# Patient Record
Sex: Female | Born: 1995
Health system: Southern US, Community
[De-identification: ages and names within clinical notes are randomized; demographics above are authoritative.]

## PROBLEM LIST (undated history)

## (undated) DIAGNOSIS — N289 Disorder of kidney and ureter, unspecified: Secondary | ICD-10-CM

## (undated) DIAGNOSIS — G43909 Migraine, unspecified, not intractable, without status migrainosus: Secondary | ICD-10-CM

## (undated) DIAGNOSIS — N3090 Cystitis, unspecified without hematuria: Secondary | ICD-10-CM

## (undated) DIAGNOSIS — R112 Nausea with vomiting, unspecified: Secondary | ICD-10-CM

## (undated) DIAGNOSIS — K589 Irritable bowel syndrome without diarrhea: Secondary | ICD-10-CM

## (undated) DIAGNOSIS — Z9889 Other specified postprocedural states: Secondary | ICD-10-CM

## (undated) HISTORY — PX: WISDOM TOOTH EXTRACTION: SHX21

## (undated) NOTE — *Deleted (*Deleted)
HEMATOLOGY/ONCOLOGY CONSULTATION NOTE  Date of Service: 08/20/2020  Patient Care Team: System, Provider Not In as PCP - General  CHIEF COMPLAINTS/PURPOSE OF CONSULTATION:  Anemia  HISTORY OF PRESENTING ILLNESS:  Mary Orozco is a wonderful 17 y.o. female who has been referred to [place] by Jarrett Soho, PA-C for evaluation and management of anemia. Pt is accompanied today by ***. The pt reports that she is doing well overall.   The pt reports ***   Of note prior to the patient's visit today, pt has had *** completed on *** with results revealing ***.   Most recent lab results (***) of CBC is as follows: all values are WNL except for ***.  On review of systems, pt reports *** and denies ***and any other symptoms.   On PMHx the pt reports ***. On Social Hx the pt reports *** On Family Hx the pt reports ***  A: -Discussed patient's most recent labs from ***, *** -***  MEDICAL HISTORY:  Past Medical History:  Diagnosis Date  . Cystitis   . IBS (irritable bowel syndrome)   . Migraine   . Renal disorder    kidney stones    SURGICAL HISTORY: Past Surgical History:  Procedure Laterality Date  . WISDOM TOOTH EXTRACTION      SOCIAL HISTORY: Social History   Socioeconomic History  . Marital status: Married    Spouse name: Not on file  . Number of children: Not on file  . Years of education: Not on file  . Highest education level: Not on file  Occupational History  . Not on file  Tobacco Use  . Smoking status: Never Smoker  . Smokeless tobacco: Never Used  Substance and Sexual Activity  . Alcohol use: No  . Drug use: No  . Sexual activity: Yes  Other Topics Concern  . Not on file  Social History Narrative  . Not on file   Social Determinants of Health   Financial Resource Strain:   . Difficulty of Paying Living Expenses: Not on file  Food Insecurity:   . Worried About Programme researcher, broadcasting/film/video in the Last Year: Not on file  . Ran Out of Food in the  Last Year: Not on file  Transportation Needs:   . Lack of Transportation (Medical): Not on file  . Lack of Transportation (Non-Medical): Not on file  Physical Activity:   . Days of Exercise per Week: Not on file  . Minutes of Exercise per Session: Not on file  Stress:   . Feeling of Stress : Not on file  Social Connections:   . Frequency of Communication with Friends and Family: Not on file  . Frequency of Social Gatherings with Friends and Family: Not on file  . Attends Religious Services: Not on file  . Active Member of Clubs or Organizations: Not on file  . Attends Banker Meetings: Not on file  . Marital Status: Not on file  Intimate Partner Violence:   . Fear of Current or Ex-Partner: Not on file  . Emotionally Abused: Not on file  . Physically Abused: Not on file  . Sexually Abused: Not on file    FAMILY HISTORY: Family History  Problem Relation Age of Onset  . Diabetes Other   . CAD Other     ALLERGIES:  is allergic to diflucan [fluconazole] and sulfa antibiotics.  MEDICATIONS:  Current Outpatient Medications  Medication Sig Dispense Refill  . ALPRAZolam (XANAX) 0.5 MG tablet Take 0.5  mg by mouth 2 (two) times daily as needed for anxiety.    Marland Kitchen amitriptyline (ELAVIL) 10 MG tablet Take 10 mg by mouth at bedtime.    Marland Kitchen esomeprazole (NEXIUM) 40 MG capsule Take 40 mg by mouth daily at 12 noon.    Marland Kitchen ibuprofen (ADVIL,MOTRIN) 600 MG tablet Take 1 tablet (600 mg total) by mouth every 6 (six) hours as needed. 30 tablet 0  . loratadine (CLARITIN) 10 MG tablet Take 10 mg by mouth daily.    . nitrofurantoin (MACRODANTIN) 100 MG capsule Take 100 mg by mouth daily.     . ondansetron (ZOFRAN) 4 MG tablet Take 1 tablet (4 mg total) by mouth every 6 (six) hours. 12 tablet 0  . pentosan polysulfate (ELMIRON) 100 MG capsule Take 1 capsule (100 mg total) by mouth 3 (three) times daily. 90 capsule 2  . pregabalin (LYRICA) 150 MG capsule Take 150 mg by mouth 2 (two) times  daily.    . ranitidine (ZANTAC) 150 MG tablet Take 150 mg by mouth 2 (two) times daily as needed for heartburn.     . tamsulosin (FLOMAX) 0.4 MG CAPS capsule Take 1 capsule (0.4 mg total) by mouth daily. 5 capsule 0   No current facility-administered medications for this visit.    REVIEW OF SYSTEMS:    10 Point review of Systems was done is negative except as noted above.  PHYSICAL EXAMINATION: ECOG PERFORMANCE STATUS: {CHL ONC ECOG ZO:[id number]}  .There were no vitals filed for this visit. There were no vitals filed for this visit. .There is no height or weight on file to calculate BMI.  *** GENERAL:alert, in no acute distress and comfortable SKIN: no acute rashes, no significant lesions EYES: conjunctiva are pink and non-injected, sclera anicteric OROPHARYNX: MMM, no exudates, no oropharyngeal erythema or ulceration NECK: supple, no JVD LYMPH:  no palpable lymphadenopathy in the cervical, axillary or inguinal regions LUNGS: clear to auscultation b/l with normal respiratory effort HEART: regular rate & rhythm ABDOMEN:  normoactive bowel sounds , non tender, not distended. Extremity: no pedal edema PSYCH: alert & oriented x 3 with fluent speech NEURO: no focal motor/sensory deficits  LABORATORY DATA:  I have reviewed the data as listed  . CBC Latest Ref Rng & Units 08/23/2018 03/27/2017 09/14/2016  WBC 4.0 - 10.5 K/uL 8.3 9.2 9.2  Hemoglobin 12.0 - 15.0 g/dL 10.2(L) 11.5(L) 7.6(L)  Hematocrit 36 - 46 % 34.7(L) 36.4 24.8(L)  Platelets 150 - 400 K/uL 369 362 225    . CMP Latest Ref Rng & Units 08/23/2018 03/27/2017 09/14/2016  Glucose 70 - 99 mg/dL 89 96 811(B)  BUN 6 - 20 mg/dL 11 7 12   Creatinine 0.44 - 1.00 mg/dL 1.47 8.29 5.62  Sodium 135 - 145 mmol/L 137 135 136  Potassium 3.5 - 5.1 mmol/L 4.3 4.1 3.8  Chloride 98 - 111 mmol/L 105 102 104  CO2 22 - 32 mmol/L 24 25 25   Calcium 8.9 - 10.3 mg/dL 9.4 9.1 1.3(Y)  Total Protein 6.5 - 8.1 g/dL 7.2 - 6.7  Total Bilirubin  0.3 - 1.2 mg/dL 0.7 - 1.1  Alkaline Phos 38 - 126 U/L 85 - 55  AST 15 - 41 U/L 28 - 23  ALT 0 - 44 U/L 15 - 12(L)     RADIOGRAPHIC STUDIES: I have personally reviewed the radiological images as listed and agreed with the findings in the report. No results found.  ASSESSMENT & PLAN:   PLAN: ***   FOLLOW UP: ***  All of the patients questions were answered with apparent satisfaction. The patient knows to call the clinic with any problems, questions or concerns.  I spent *** counseling the patient face to face. The total time spent in the appointment was *** and more than 50% was on counseling and direct patient cares.    Wyvonnia Lora MD MS AAHIVMS [facility] Hematology/Oncology Physician [facility]  (Office):       [phone] (Work cell):  [phone] (Fax):           [phone]  08/20/2020 8:00 AM  I, Carollee Herter, am acting as a scribe for Dr. Wyvonnia Lora.   {Add Production assistant, radio Statement}

---

## 2015-01-09 ENCOUNTER — Emergency Department (INDEPENDENT_AMBULATORY_CARE_PROVIDER_SITE_OTHER)
Admission: EM | Admit: 2015-01-09 | Discharge: 2015-01-09 | Disposition: A | Discharge status: Home / Self Care | Payer: No Typology Code available for payment source | Source: Home / Self Care | Attending: Family Medicine | Admitting: Family Medicine

## 2015-01-09 ENCOUNTER — Encounter (HOSPITAL_COMMUNITY): Payer: Self-pay

## 2015-01-09 DIAGNOSIS — S86811A Strain of other muscle(s) and tendon(s) at lower leg level, right leg, initial encounter: Secondary | ICD-10-CM

## 2015-01-09 DIAGNOSIS — S86111A Strain of other muscle(s) and tendon(s) of posterior muscle group at lower leg level, right leg, initial encounter: Secondary | ICD-10-CM

## 2015-01-09 NOTE — ED Provider Notes (Signed)
CSN: [id number]     Arrival date & time 01/09/15  1620 History   First MD Initiated Contact with Patient 01/09/15 1722     Chief Complaint  Patient presents with  . Leg Pain   (Consider location/radiation/quality/duration/timing/severity/associated sxs/prior Treatment) Patient is a 19 y.o. female presenting with leg pain. The history is provided by the patient and a parent.  Leg Pain Location:  Leg Time since incident:  2 days Injury: no   Leg location:  R upper leg Pain details:    Quality:  Sharp   Radiates to:  Does not radiate   Severity:  Mild   Onset quality:  Gradual   Progression:  Unchanged Chronicity:  New Dislocation: no   Prior injury to area:  No Relieved by:  None tried Worsened by:  Nothing tried Ineffective treatments:  None tried Associated symptoms: no decreased ROM, no muscle weakness, no numbness and no swelling   Risk factors comment:  Takes depoprovera and concerned for dvt.   History reviewed. No pertinent past medical history. History reviewed. No pertinent past surgical history. History reviewed. No pertinent family history. History  Substance Use Topics  . Smoking status: Never Smoker   . Smokeless tobacco: Not on file  . Alcohol Use: No   OB History    No data available     Review of Systems  Constitutional: Negative.   Respiratory: Negative for chest tightness and shortness of breath.   Cardiovascular: Negative.   Musculoskeletal: Negative for joint swelling and gait problem.  Skin: Negative.     Allergies  Sulfa antibiotics  Home Medications   Prior to Admission medications   Not on File   BP 115/82 mmHg  Pulse 88  Temp(Src) 98.8 F (37.1 C) (Oral)  Resp 18  SpO2 98% Physical Exam  Constitutional: She is oriented to person, place, and time. She appears well-developed and well-nourished.  Musculoskeletal: Normal range of motion. She exhibits tenderness.  Right prox post calf soreness, no leg edema, pulses nlno warmth or  erythema, increased soreness with stretching and forced knee flexion.  Neurological: She is alert and oriented to person, place, and time.  Skin: Skin is warm and dry.  Nursing note and vitals reviewed.   ED Course  Procedures (including critical care time) Labs Review Labs Reviewed - No data to display  Imaging Review No results found.   MDM   1. Gastrocnemius strain, right, initial encounter        Linna HoffJames D Larenz Frasier, MD 01/09/15 1754

## 2015-01-09 NOTE — Discharge Instructions (Signed)
Heat ,stretch and advil for soreness as needed, activity as tolerated, return to ER if further problems.

## 2015-01-09 NOTE — ED Notes (Signed)
C/o pain in right calf x couple of days. Used Asprin for pain x 2

## 2015-01-12 ENCOUNTER — Encounter (HOSPITAL_COMMUNITY): Payer: Self-pay | Admitting: Emergency Medicine

## 2015-01-12 ENCOUNTER — Emergency Department (HOSPITAL_COMMUNITY)
Admission: EM | Admit: 2015-01-12 | Discharge: 2015-01-12 | Disposition: A | Discharge status: Home / Self Care | Payer: No Typology Code available for payment source | Attending: Emergency Medicine | Admitting: Emergency Medicine

## 2015-01-12 DIAGNOSIS — Z87440 Personal history of urinary (tract) infections: Secondary | ICD-10-CM | POA: Diagnosis not present

## 2015-01-12 DIAGNOSIS — Z792 Long term (current) use of antibiotics: Secondary | ICD-10-CM | POA: Insufficient documentation

## 2015-01-12 DIAGNOSIS — Z8679 Personal history of other diseases of the circulatory system: Secondary | ICD-10-CM | POA: Insufficient documentation

## 2015-01-12 DIAGNOSIS — N12 Tubulo-interstitial nephritis, not specified as acute or chronic: Secondary | ICD-10-CM | POA: Insufficient documentation

## 2015-01-12 DIAGNOSIS — Z87442 Personal history of urinary calculi: Secondary | ICD-10-CM | POA: Insufficient documentation

## 2015-01-12 DIAGNOSIS — R109 Unspecified abdominal pain: Secondary | ICD-10-CM | POA: Diagnosis present

## 2015-01-12 DIAGNOSIS — Z3202 Encounter for pregnancy test, result negative: Secondary | ICD-10-CM | POA: Diagnosis not present

## 2015-01-12 HISTORY — DX: Migraine, unspecified, not intractable, without status migrainosus: G43.909

## 2015-01-12 HISTORY — DX: Disorder of kidney and ureter, unspecified: N28.9

## 2015-01-12 LAB — URINALYSIS, ROUTINE W REFLEX MICROSCOPIC
Bilirubin Urine: NEGATIVE
Glucose, UA: NEGATIVE mg/dL
Ketones, ur: NEGATIVE mg/dL
Nitrite: POSITIVE — AB
Protein, ur: NEGATIVE mg/dL
Specific Gravity, Urine: 1.006 (ref 1.005–1.030)
Urobilinogen, UA: 1 mg/dL (ref 0.0–1.0)
pH: 7 (ref 5.0–8.0)

## 2015-01-12 LAB — BASIC METABOLIC PANEL
Anion gap: 9 (ref 5–15)
BUN: 8 mg/dL (ref 6–23)
CO2: 23 mmol/L (ref 19–32)
Calcium: 9.2 mg/dL (ref 8.4–10.5)
Chloride: 108 mmol/L (ref 96–112)
Creatinine, Ser: 0.68 mg/dL (ref 0.50–1.10)
GFR calc Af Amer: >90 mL/min (ref >90)
GFR calc non Af Amer: >90 mL/min (ref >90)
Glucose, Bld: 98 mg/dL (ref 70–99)
Potassium: 4.1 mmol/L (ref 3.5–5.1)
Sodium: 140 mmol/L (ref 135–145)

## 2015-01-12 LAB — CBC WITH DIFFERENTIAL/PLATELET
Basophils Absolute: 0 10*3/uL (ref 0.0–0.1)
Basophils Relative: 0 % (ref 0–1)
Eosinophils Absolute: 0.1 10*3/uL (ref 0.0–0.7)
Eosinophils Relative: 1 % (ref 0–5)
HCT: 35.4 % — ABNORMAL LOW (ref 36.0–46.0)
Hemoglobin: 11.3 g/dL — ABNORMAL LOW (ref 12.0–15.0)
Lymphocytes Relative: 4 % — ABNORMAL LOW (ref 12–46)
Lymphs Abs: 0.6 10*3/uL — ABNORMAL LOW (ref 0.7–4.0)
MCH: 26 pg (ref 26.0–34.0)
MCHC: 31.9 g/dL (ref 30.0–36.0)
MCV: 81.6 fL (ref 78.0–100.0)
Monocytes Absolute: 1.1 10*3/uL — ABNORMAL HIGH (ref 0.1–1.0)
Monocytes Relative: 8 % (ref 3–12)
Neutro Abs: 12.3 10*3/uL — ABNORMAL HIGH (ref 1.7–7.7)
Neutrophils Relative %: 87 % — ABNORMAL HIGH (ref 43–77)
Platelets: 279 10*3/uL (ref 150–400)
RBC: 4.34 MIL/uL (ref 3.87–5.11)
RDW: 13.5 % (ref 11.5–15.5)
WBC: 14 10*3/uL — ABNORMAL HIGH (ref 4.0–10.5)

## 2015-01-12 LAB — URINE MICROSCOPIC-ADD ON

## 2015-01-12 LAB — PREGNANCY, URINE: Preg Test, Ur: NEGATIVE

## 2015-01-12 MED ORDER — DEXTROSE 5 % IV SOLN
1.0000 g | Freq: Once | INTRAVENOUS | Status: AC
  Administered 2015-01-12: 1 g via INTRAVENOUS
  Filled 2015-01-12: qty 10

## 2015-01-12 MED ORDER — HYDROMORPHONE HCL 1 MG/ML IJ SOLN
1.0000 mg | Freq: Once | INTRAMUSCULAR | Status: AC
  Administered 2015-01-12: 1 mg via INTRAVENOUS
  Filled 2015-01-12: qty 1

## 2015-01-12 MED ORDER — OXYCODONE-ACETAMINOPHEN 5-325 MG PO TABS: 1.0000 | ORAL_TABLET | ORAL | Status: DC | PRN

## 2015-01-12 MED ORDER — CEPHALEXIN 500 MG PO CAPS: 500.0000 mg | ORAL_CAPSULE | Freq: Four times a day (QID) | ORAL | Status: DC

## 2015-01-12 MED ORDER — SODIUM CHLORIDE 0.9 % IV BOLUS (SEPSIS)
1000.0000 mL | Freq: Once | INTRAVENOUS | Status: AC
  Administered 2015-01-12: 1000 mL via INTRAVENOUS

## 2015-01-12 MED ORDER — KETOROLAC TROMETHAMINE 15 MG/ML IJ SOLN
15.0000 mg | Freq: Once | INTRAMUSCULAR | Status: AC
  Administered 2015-01-12: 15 mg via INTRAVENOUS
  Filled 2015-01-12: qty 1

## 2015-01-12 MED ORDER — ONDANSETRON HCL 4 MG PO TABS: 4.0000 mg | ORAL_TABLET | Freq: Four times a day (QID) | ORAL | Status: AC

## 2015-01-12 MED ORDER — ONDANSETRON HCL 4 MG/2ML IJ SOLN
4.0000 mg | Freq: Once | INTRAMUSCULAR | Status: AC
  Administered 2015-01-12: 4 mg via INTRAVENOUS
  Filled 2015-01-12: qty 2

## 2015-01-12 NOTE — Discharge Instructions (Signed)
Pyelonephritis, Adult °Pyelonephritis is a kidney infection. In general, there are 2 main types of pyelonephritis: °· Infections that come on quickly without any warning (acute pyelonephritis). °· Infections that persist for a long period of time (chronic pyelonephritis). °CAUSES  °Two main causes of pyelonephritis are: °· Bacteria traveling from the bladder to the kidney. This is a problem especially in pregnant women. The urine in the bladder can become filled with bacteria from multiple causes, including: °¨ Inflammation of the prostate gland (prostatitis). °¨ Sexual intercourse in females. °¨ Bladder infection (cystitis). °· Bacteria traveling from the bloodstream to the tissue part of the kidney. °Problems that may increase your risk of getting a kidney infection include: °· Diabetes. °· Kidney stones or bladder stones. °· Cancer. °· Catheters placed in the bladder. °· Other abnormalities of the kidney or ureter. °SYMPTOMS  °· Abdominal pain. °· Pain in the side or flank area. °· Fever. °· Chills. °· Upset stomach. °· Blood in the urine (dark urine). °· Frequent urination. °· Strong or persistent urge to urinate. °· Burning or stinging when urinating. °DIAGNOSIS  °Your caregiver may diagnose your kidney infection based on your symptoms. A urine sample may also be taken. °TREATMENT  °In general, treatment depends on how severe the infection is.  °· If the infection is mild and caught early, your caregiver may treat you with oral antibiotics and send you home. °· If the infection is more severe, the bacteria may have gotten into the bloodstream. This will require intravenous (IV) antibiotics and a hospital stay. Symptoms may include: °¨ High fever. °¨ Severe flank pain. °¨ Shaking chills. °· Even after a hospital stay, your caregiver may require you to be on oral antibiotics for a period of time. °· Other treatments may be required depending upon the cause of the infection. °HOME CARE INSTRUCTIONS  °· Take your  antibiotics as directed. Finish them even if you start to feel better. °· Make an appointment to have your urine checked to make sure the infection is gone. °· Drink enough fluids to keep your urine clear or pale yellow. °· Take medicines for the bladder if you have urgency and frequency of urination as directed by your caregiver. °SEEK IMMEDIATE MEDICAL CARE IF:  °· You have a fever or persistent symptoms for more than 2-3 days. °· You have a fever and your symptoms suddenly get worse. °· You are unable to take your antibiotics or fluids. °· You develop shaking chills. °· You experience extreme weakness or fainting. °· There is no improvement after 2 days of treatment. °MAKE SURE YOU: °· Understand these instructions. °· Will watch your condition. °· Will get help right away if you are not doing well or get worse. °Document Released: 09/28/2005 Document Revised: 03/29/2012 Document Reviewed: 03/04/2011 °ExitCare® Patient Information ©2015 [employer]. This information is not intended to replace advice given to you by your health care provider. Make sure you discuss any questions you have with your health care provider. ° °

## 2015-01-12 NOTE — ED Notes (Signed)
Bed: [id number] Expected date: 01/12/15 Expected time: 11:33 AM Means of arrival: Ambulance Comments: Flank Pain

## 2015-01-12 NOTE — ED Notes (Signed)
[facility]Per EMS pt c/o L flank pain x1 day, but has been tx for UTI in the past week. Pt taking 2nd round of abx. Pt adds that she has bladder pain. Pt reports 10/10 pain. Pt reports hx of kidney stones and sts that this is different. Pt has also had N/V/D. Pt is A&O and ambulatory on scene.

## 2015-01-12 NOTE — ED Notes (Signed)
Pt c/o dysuria and frequency. Pt on 2nd round of abx, Macrobid. Pt is A&O and in NAD

## 2015-01-14 LAB — URINE CULTURE: Colony Count: 8000

## 2015-01-21 NOTE — ED Provider Notes (Signed)
CSN: [id number]     Arrival date & time 01/12/15  1131 History   First MD Initiated Contact with Patient 01/12/15 1136     Chief Complaint  Patient presents with  . Flank Pain     (Consider location/radiation/quality/duration/timing/severity/associated sxs/prior Treatment) HPI   19 year old female with left flank pain. Onset about a day ago. Progressively worsening. Patient recently diagnosed with UTI and has been having dysuria. Prescribed antibiotics and has been compliant but symptoms have persisted. So she will nausea and vomiting. Subjective fever. She was initially started on levofloxacin and the distal ends to nitrofurantoin. She additionally has a past history of kidney stones and says current symptoms feel different than that.  Past Medical History  Diagnosis Date  . Renal disorder     kidney stones  . Migraine    Past Surgical History  Procedure Laterality Date  . Wisdom tooth extraction     No family history on file. History  Substance Use Topics  . Smoking status: Never Smoker   . Smokeless tobacco: Not on file  . Alcohol Use: No   OB History    No data available     Review of Systems  All systems reviewed and negative, other than as noted in HPI.   Allergies  Diflucan and Sulfa antibiotics  Home Medications   Prior to Admission medications   Medication Sig Start Date End Date Taking? Authorizing Provider  medroxyPROGESTERone (DEPO-PROVERA) 150 MG/ML injection  12/14/14  Yes Historical Provider, MD  mupirocin ointment (BACTROBAN) 2 % Apply 1 application topically daily.  01/05/15  Yes Historical Provider, MD  cephALEXin (KEFLEX) 500 MG capsule Take 1 capsule (500 mg total) by mouth 4 (four) times daily. 01/12/15   Raeford Razor, MD  ondansetron (ZOFRAN) 4 MG tablet Take 1 tablet (4 mg total) by mouth every 6 (six) hours. 01/12/15   Raeford Razor, MD  oxyCODONE-acetaminophen (PERCOCET/ROXICET) 5-325 MG per tablet Take 1 tablet by mouth every 4 (four) hours as  needed for severe pain. 01/12/15   Raeford Razor, MD   BP 108/57 mmHg  Pulse 87  Temp(Src) 99.1 F (37.3 C) (Oral)  Resp 16  SpO2 98% Physical Exam  Constitutional: She appears well-developed and well-nourished. No distress.  HENT:  Head: Normocephalic and atraumatic.  Eyes: Conjunctivae are normal. Right eye exhibits no discharge. Left eye exhibits no discharge.  Neck: Neck supple.  Cardiovascular: Normal rate, regular rhythm and normal heart sounds.  Exam reveals no gallop and no friction rub.   No murmur heard. Pulmonary/Chest: Effort normal and breath sounds normal. No respiratory distress.  Abdominal: Soft. She exhibits no distension. There is no tenderness.  Genitourinary:  Left CVA tenderness  Musculoskeletal: She exhibits no edema or tenderness.  Neurological: She is alert.  Skin: Skin is warm and dry.  Psychiatric: She has a normal mood and affect. Her behavior is normal. Thought content normal.  Nursing note and vitals reviewed.   ED Course  Procedures (including critical care time) Labs Review Labs Reviewed  URINALYSIS, ROUTINE W REFLEX MICROSCOPIC - Abnormal; Notable for the following:    Color, Urine ORANGE (*)    Hgb urine dipstick TRACE (*)    Nitrite POSITIVE (*)    Leukocytes, UA TRACE (*)    All other components within normal limits  CBC WITH DIFFERENTIAL/PLATELET - Abnormal; Notable for the following:    WBC 14.0 (*)    Hemoglobin 11.3 (*)    HCT 35.4 (*)    Neutrophils Relative % 87 (*)  Neutro Abs 12.3 (*)    Lymphocytes Relative 4 (*)    Lymphs Abs 0.6 (*)    Monocytes Absolute 1.1 (*)    All other components within normal limits  URINE CULTURE  BASIC METABOLIC PANEL  PREGNANCY, URINE  URINE MICROSCOPIC-ADD ON    Imaging Review No results found.   EKG Interpretation None      MDM   Final diagnoses:  Pyelonephritis    19 year old female with left flank pain. Clinically pyelonephritis. Afebrile. Does not appear toxic. Started on  IV Rocephin. Will change class of antibiotics given continued symptoms despite appropriate initial antibiotic choice. It has been determined that no acute conditions requiring further emergency intervention are present at this time. The patient has been advised of the diagnosis and plan. I reviewed any labs and imaging including any potential incidental findings. We have discussed signs and symptoms that warrant return to the ED and they are listed in the discharge instructions.      Raeford RazorStephen Hallie Ishida, MD 01/21/15 [phone]

## 2016-09-11 ENCOUNTER — Encounter (HOSPITAL_COMMUNITY): Payer: Self-pay | Admitting: Emergency Medicine

## 2016-09-11 ENCOUNTER — Inpatient Hospital Stay (HOSPITAL_COMMUNITY)
Admission: EM | Admit: 2016-09-11 | Discharge: 2016-09-14 | DRG: 811 | Disposition: A | Discharge status: Home / Self Care | Payer: Managed Care, Other (non HMO) | Attending: Internal Medicine | Admitting: Internal Medicine

## 2016-09-11 ENCOUNTER — Emergency Department (HOSPITAL_COMMUNITY): Payer: Managed Care, Other (non HMO)

## 2016-09-11 DIAGNOSIS — N3010 Interstitial cystitis (chronic) without hematuria: Secondary | ICD-10-CM | POA: Diagnosis present

## 2016-09-11 DIAGNOSIS — R17 Unspecified jaundice: Secondary | ICD-10-CM | POA: Diagnosis present

## 2016-09-11 DIAGNOSIS — D550 Anemia due to glucose-6-phosphate dehydrogenase [G6PD] deficiency: Principal | ICD-10-CM | POA: Diagnosis present

## 2016-09-11 DIAGNOSIS — R161 Splenomegaly, not elsewhere classified: Secondary | ICD-10-CM | POA: Diagnosis present

## 2016-09-11 DIAGNOSIS — N3289 Other specified disorders of bladder: Secondary | ICD-10-CM | POA: Diagnosis present

## 2016-09-11 DIAGNOSIS — F419 Anxiety disorder, unspecified: Secondary | ICD-10-CM | POA: Diagnosis present

## 2016-09-11 DIAGNOSIS — Z79891 Long term (current) use of opiate analgesic: Secondary | ICD-10-CM

## 2016-09-11 DIAGNOSIS — R0902 Hypoxemia: Secondary | ICD-10-CM

## 2016-09-11 DIAGNOSIS — D589 Hereditary hemolytic anemia, unspecified: Secondary | ICD-10-CM | POA: Diagnosis present

## 2016-09-11 DIAGNOSIS — M797 Fibromyalgia: Secondary | ICD-10-CM | POA: Diagnosis present

## 2016-09-11 DIAGNOSIS — J9601 Acute respiratory failure with hypoxia: Secondary | ICD-10-CM | POA: Diagnosis present

## 2016-09-11 DIAGNOSIS — Z882 Allergy status to sulfonamides status: Secondary | ICD-10-CM

## 2016-09-11 DIAGNOSIS — D649 Anemia, unspecified: Secondary | ICD-10-CM | POA: Diagnosis present

## 2016-09-11 DIAGNOSIS — N390 Urinary tract infection, site not specified: Secondary | ICD-10-CM

## 2016-09-11 DIAGNOSIS — Z8249 Family history of ischemic heart disease and other diseases of the circulatory system: Secondary | ICD-10-CM

## 2016-09-11 DIAGNOSIS — N3090 Cystitis, unspecified without hematuria: Secondary | ICD-10-CM | POA: Diagnosis present

## 2016-09-11 DIAGNOSIS — K589 Irritable bowel syndrome without diarrhea: Secondary | ICD-10-CM | POA: Diagnosis present

## 2016-09-11 DIAGNOSIS — D749 Methemoglobinemia, unspecified: Secondary | ICD-10-CM | POA: Diagnosis present

## 2016-09-11 DIAGNOSIS — Z79899 Other long term (current) drug therapy: Secondary | ICD-10-CM

## 2016-09-11 DIAGNOSIS — T398X1A Poisoning by other nonopioid analgesics and antipyretics, not elsewhere classified, accidental (unintentional), initial encounter: Secondary | ICD-10-CM | POA: Diagnosis present

## 2016-09-11 DIAGNOSIS — Z888 Allergy status to other drugs, medicaments and biological substances status: Secondary | ICD-10-CM

## 2016-09-11 HISTORY — DX: Irritable bowel syndrome, unspecified: K58.9

## 2016-09-11 HISTORY — DX: Cystitis, unspecified without hematuria: N30.90

## 2016-09-11 LAB — CBC WITH DIFFERENTIAL/PLATELET
Basophils Absolute: 0 10*3/uL (ref 0.0–0.1)
Basophils Relative: 0 %
Eosinophils Absolute: 0.1 10*3/uL (ref 0.0–0.7)
Eosinophils Relative: 1 %
HCT: 27.7 % — ABNORMAL LOW (ref 36.0–46.0)
Hemoglobin: 8.8 g/dL — ABNORMAL LOW (ref 12.0–15.0)
Lymphocytes Relative: 14 %
Lymphs Abs: 1.3 10*3/uL (ref 0.7–4.0)
MCH: 30.8 pg (ref 26.0–34.0)
MCHC: 31.8 g/dL (ref 30.0–36.0)
MCV: 96.9 fL (ref 78.0–100.0)
Monocytes Absolute: 0.8 10*3/uL (ref 0.1–1.0)
Monocytes Relative: 9 %
Neutro Abs: 7 10*3/uL (ref 1.7–7.7)
Neutrophils Relative %: 76 %
Platelets: 241 10*3/uL (ref 150–400)
RBC: 2.86 MIL/uL — ABNORMAL LOW (ref 3.87–5.11)
RDW: 17.1 % — ABNORMAL HIGH (ref 11.5–15.5)
WBC: 9.1 10*3/uL (ref 4.0–10.5)

## 2016-09-11 LAB — URINALYSIS, ROUTINE W REFLEX MICROSCOPIC
Bilirubin Urine: NEGATIVE
Glucose, UA: NEGATIVE mg/dL
Hgb urine dipstick: NEGATIVE
Ketones, ur: NEGATIVE mg/dL
Nitrite: POSITIVE — AB
Protein, ur: NEGATIVE mg/dL
Specific Gravity, Urine: 1.01 (ref 1.005–1.030)
pH: 7.5 (ref 5.0–8.0)

## 2016-09-11 LAB — URINE MICROSCOPIC-ADD ON: RBC / HPF: NONE SEEN RBC/hpf (ref 0–5)

## 2016-09-11 LAB — ACETAMINOPHEN LEVEL: Acetaminophen (Tylenol), Serum: <10 ug/mL — ABNORMAL LOW (ref 10–30)

## 2016-09-11 LAB — BASIC METABOLIC PANEL
Anion gap: 7 (ref 5–15)
BUN: 12 mg/dL (ref 6–20)
CO2: 25 mmol/L (ref 22–32)
Calcium: 9.9 mg/dL (ref 8.9–10.3)
Chloride: 106 mmol/L (ref 101–111)
Creatinine, Ser: 0.81 mg/dL (ref 0.44–1.00)
GFR calc Af Amer: >60 mL/min (ref >60)
GFR calc non Af Amer: >60 mL/min (ref >60)
Glucose, Bld: 106 mg/dL — ABNORMAL HIGH (ref 65–99)
Potassium: 3.9 mmol/L (ref 3.5–5.1)
Sodium: 138 mmol/L (ref 135–145)

## 2016-09-11 LAB — HEPATIC FUNCTION PANEL
ALT: 17 U/L (ref 14–54)
AST: 29 U/L (ref 15–41)
Albumin: 4.6 g/dL (ref 3.5–5.0)
Alkaline Phosphatase: 62 U/L (ref 38–126)
Bilirubin, Direct: 0.3 mg/dL (ref 0.1–0.5)
Indirect Bilirubin: 1 mg/dL — ABNORMAL HIGH (ref 0.3–0.9)
Total Bilirubin: 1.3 mg/dL — ABNORMAL HIGH (ref 0.3–1.2)
Total Protein: 7.3 g/dL (ref 6.5–8.1)

## 2016-09-11 LAB — D-DIMER, QUANTITATIVE: D-Dimer, Quant: 0.34 ug/mL-FEU (ref 0.00–0.50)

## 2016-09-11 LAB — SALICYLATE LEVEL: Salicylate Lvl: <7 mg/dL (ref 2.8–30.0)

## 2016-09-11 MED ORDER — IOPAMIDOL (ISOVUE-370) INJECTION 76%
INTRAVENOUS | Status: AC
  Filled 2016-09-11: qty 100

## 2016-09-11 MED ORDER — SODIUM CHLORIDE 0.9 % IJ SOLN
INTRAMUSCULAR | Status: AC
  Filled 2016-09-11: qty 50

## 2016-09-11 MED ORDER — IOPAMIDOL (ISOVUE-370) INJECTION 76%
100.0000 mL | Freq: Once | INTRAVENOUS | Status: AC | PRN
  Administered 2016-09-11: 100 mL via INTRAVENOUS

## 2016-09-11 NOTE — ED Provider Notes (Signed)
[facility]-EMERGENCY DEPT Provider Note   CSN: [id number] Arrival date & time: 09/11/16  1850 By signing my name below, I, Mary Orozco, attest that this documentation has been prepared under the direction and in the presence of Mary Favor, NP. Electronically Signed: Linus Orozco, ED Scribe. 09/11/16. 8:06 PM.  History   Chief Complaint Chief Complaint  Patient presents with  . Anemia   The history is provided by the patient. No language interpreter was used.   HPI Comments: Mary Orozco is a 20 y.o. female who presents to the Emergency Department with a PMHx of anemia and IBS for an evaluation of a possible anemic episode, prior to arrival. Pt was seen at the [facility] to be evaluated for a possible UTI. When the medical staff checked her vitals, they noted a low SPO2 level. She was then sent to the ED for further evaluation. Pt states she has never needed a blood transfusion in the past. She takes iron supplements with orange juice but for the past week she has been noncompliant. Pt denies any fevers, chills, CP, SOB, N/V/D, HA, blood in stool  or any other symptoms at this time.   Past Medical History:  Diagnosis Date  . Cystitis   . IBS (irritable bowel syndrome)   . Migraine   . Renal disorder    kidney stones    Patient Active Problem List   Diagnosis Date Noted  . Hypoxia 09/12/2016  . Symptomatic anemia 09/12/2016  . Splenomegaly 09/12/2016    Past Surgical History:  Procedure Laterality Date  . WISDOM TOOTH EXTRACTION      OB History    No data available       Home Medications    Prior to Admission medications   Medication Sig Start Date End Date Taking? Authorizing Provider  ALPRAZolam Prudy Feeler) 0.5 MG tablet Take 0.5 mg by mouth 2 (two) times daily as needed for anxiety.   Yes Historical Provider, MD  NALTREXONE HCL PO Take 1.5 mg by mouth at bedtime.    Yes Historical Provider, MD  phenazopyridine (PYRIDIUM) 95 MG tablet Take 95 mg by mouth every 4  (four) hours as needed for pain.   Yes Historical Provider, MD  pregabalin (LYRICA) 150 MG capsule Take 150 mg by mouth 2 (two) times daily.   Yes Historical Provider, MD  ranitidine (ZANTAC) 150 MG tablet Take 150 mg by mouth 2 (two) times daily.   Yes Historical Provider, MD  cephALEXin (KEFLEX) 500 MG capsule Take 1 capsule (500 mg total) by mouth 4 (four) times daily. Patient not taking: Reported on 09/11/2016 01/12/15   Mary Razor, MD  medroxyPROGESTERone (DEPO-PROVERA) 150 MG/ML injection  12/14/14   Historical Provider, MD  mupirocin ointment (BACTROBAN) 2 % Apply 1 application topically daily.  01/05/15   Historical Provider, MD  Naltrexone POWD Take 1.5 mg by mouth at bedtime.    Historical Provider, MD  ondansetron (ZOFRAN) 4 MG tablet Take 1 tablet (4 mg total) by mouth every 6 (six) hours. Patient not taking: Reported on 09/11/2016 01/12/15   Mary Razor, MD  oxyCODONE-acetaminophen (PERCOCET/ROXICET) 5-325 MG per tablet Take 1 tablet by mouth every 4 (four) hours as needed for severe pain. Patient not taking: Reported on 09/11/2016 01/12/15   Mary Razor, MD    Family History Family History  Problem Relation Age of Onset  . Diabetes Other   . CAD Other     Social History Social History  Substance Use Topics  . Smoking status:  Never Smoker  . Smokeless tobacco: Never Used  . Alcohol use No    Allergies   Diflucan [fluconazole] and Sulfa antibiotics  Review of Systems Review of Systems  Constitutional: Negative for activity change, appetite change, chills and fever.  HENT: Negative for congestion and rhinorrhea.   Respiratory: Negative for cough and shortness of breath.   Cardiovascular: Negative for chest pain and leg swelling.  Gastrointestinal: Negative for abdominal pain, anal bleeding, blood in stool, diarrhea, nausea and vomiting.  Genitourinary: Positive for dysuria. Negative for flank pain, frequency, vaginal bleeding and vaginal discharge.  Musculoskeletal:  Negative for myalgias.  Skin: Negative for rash.  Neurological: Negative for dizziness and headaches.  All other systems reviewed and are negative.  Physical Exam Updated Vital Signs BP 137/81 (BP Location: Right Arm)   Pulse 95   Temp 99.3 F (37.4 C) (Oral)   Resp 20   Ht 5\' 4"  (1.626 m)   Wt 90.8 kg   LMP 08/25/2016   SpO2 93%   BMI 34.37 kg/m   Physical Exam  Constitutional: She is oriented to person, place, and time. She appears well-developed and well-nourished. No distress.  HENT:  Head: Normocephalic.  Eyes: Pupils are equal, round, and reactive to light. Scleral icterus is present.  Neck: Normal range of motion.  Cardiovascular: Regular rhythm.  Tachycardia present.   Pulmonary/Chest: Effort normal and breath sounds normal.  Abdominal: Soft. Bowel sounds are normal. She exhibits no distension. There is no tenderness.  Musculoskeletal: Normal range of motion.  Neurological: She is alert and oriented to person, place, and time.  Skin: Skin is warm and dry. She is not diaphoretic. There is pallor.  Psychiatric: She has a normal mood and affect.  Nursing note and vitals reviewed.   ED Treatments / Results  DIAGNOSTIC STUDIES: Oxygen Saturation is 83% on room air, low by my interpretation.    COORDINATION OF CARE: 8:06 PM Discussed treatment plan with pt at bedside and pt agreed to plan.  Labs (all labs ordered are listed, but only abnormal results are displayed) Labs Reviewed  CBC WITH DIFFERENTIAL/PLATELET - Abnormal; Notable for the following:       Result Value   RBC 2.86 (*)    Hemoglobin 8.8 (*)    HCT 27.7 (*)    RDW 17.1 (*)    All other components within normal limits  BASIC METABOLIC PANEL - Abnormal; Notable for the following:    Glucose, Bld 106 (*)    All other components within normal limits  URINALYSIS, ROUTINE W REFLEX MICROSCOPIC (NOT AT [facility]) - Abnormal; Notable for the following:    Color, Urine ORANGE (*)    APPearance CLOUDY (*)     Nitrite POSITIVE (*)    Leukocytes, UA LARGE (*)    All other components within normal limits  URINE MICROSCOPIC-ADD ON - Abnormal; Notable for the following:    Squamous Epithelial / LPF 6-30 (*)    Bacteria, UA FEW (*)    All other components within normal limits  HEPATIC FUNCTION PANEL - Abnormal; Notable for the following:    Total Bilirubin 1.3 (*)    Indirect Bilirubin 1.0 (*)    All other components within normal limits  ACETAMINOPHEN LEVEL - Abnormal; Notable for the following:    Acetaminophen (Tylenol), Serum <10 (*)    All other components within normal limits  URINE CULTURE  D-DIMER, QUANTITATIVE (NOT AT [facility])  SALICYLATE LEVEL  MISC [facility] TEST (SEND OUT)  VITAMIN B12  FOLATE  IRON AND TIBC  FERRITIN  RETICULOCYTES  HAPTOGLOBIN  LACTATE DEHYDROGENASE  FIBRINOGEN  SAVE SMEAR  PATHOLOGIST SMEAR REVIEW  TECHNOLOGIST SMEAR REVIEW  HIV ANTIBODY (ROUTINE TESTING)    EKG  EKG Interpretation None       Radiology Dg Chest 2 View  Result Date: 09/11/2016 CLINICAL DATA:  Hypoxia EXAM: CHEST  2 VIEW COMPARISON:  None. FINDINGS: The heart size and mediastinal contours are within normal limits. Both lungs are clear. The visualized skeletal structures are unremarkable. IMPRESSION: No active cardiopulmonary disease. Electronically Signed   By: Jasmine PangKim  Fujinaga M.D.   On: 09/11/2016 21:14   Ct Angio Chest Pe W/cm &/or Wo Cm  Result Date: 09/11/2016 CLINICAL DATA:  20 y/o F; history of anemia and irritable bowel syndrome with low oxygen saturation. EXAM: CT ANGIOGRAPHY CHEST WITH CONTRAST TECHNIQUE: Multidetector CT imaging of the chest was performed using the standard protocol during bolus administration of intravenous contrast. Multiplanar CT image reconstructions and MIPs were obtained to evaluate the vascular anatomy. CONTRAST:  100 cc Isovue 370 COMPARISON:  09/11/2016 chest radiograph FINDINGS: Cardiovascular: Satisfactory opacification of the pulmonary arteries to the  segmental level. No evidence of pulmonary embolism. Normal heart size. No pericardial effusion. Mediastinum/Nodes: No enlarged mediastinal, hilar, or axillary lymph nodes. Thyroid gland, trachea, and esophagus demonstrate no significant findings. Lungs/Pleura: Lungs are clear. No pleural effusion or pneumothorax. Upper Abdomen: Partially visualize splenomegaly. Musculoskeletal: No chest wall abnormality. No acute or significant osseous findings. Review of the MIP images confirms the above findings. IMPRESSION: 1. No evidence of pulmonary embolus. 2. Clear lungs. 3. Partially visualize splenomegaly. Electronically Signed   By: Mitzi HansenLance  Furusawa-Stratton M.D.   On: 09/11/2016 22:59    Procedures Procedures (including critical care time)  Medications Ordered in ED Medications  iopamidol (ISOVUE-370) 76 % injection 100 mL (100 mLs Intravenous Contrast Given 09/11/16 2239)     Initial Impression / Assessment and Plan / ED Course  I have reviewed the triage vital signs and the nursing notes.  Pertinent labs & imaging results that were available during my care of the patient were reviewed by me and considered in my medical decision making (see chart for details).  Clinical Course   Patient will be admitted with unexplained hypoxia, anemia, and a urinary tract infection.  This revealed mild anemia with a hemoglobin 8.8, hematocrit of 27.7.  Chest x-ray is clear.  D-dimer was normal, but wheezing CTs or chest.  Anyway, to help explain identified cause for hypoxia.  This was negative.  Patient does not appear to be any distress.  She is satting 90-92% on a nonrebreather. She will be admitted for further investigation of hypoxia    Final Clinical Impressions(s) / ED Diagnoses   Final diagnoses:  Hypoxia  Anemia, unspecified type  Urinary tract infection without hematuria, site unspecified    New Prescriptions New Prescriptions   No medications on file   I personally performed the services  described in this documentation, which was scribed in my presence. The recorded information has been reviewed and is accurate.   Mary FavorGail Cache Decoursey, NP 09/12/16 [id number]    Pricilla LovelessScott Goldston, MD 09/14/16 1051

## 2016-09-11 NOTE — Progress Notes (Signed)
Several attempts made by 2 RT's for blood draws were unsuccessful. Hold arterial sticks for now per Dr. Criss AlvineGoldston.

## 2016-09-11 NOTE — ED Notes (Signed)
Patient transported to X-ray 

## 2016-09-11 NOTE — ED Triage Notes (Signed)
Patient was a [facility] and was told to come to hospital due to low iron levels.  She has a history of anemia.   BP:140/80 HR:112 R:18

## 2016-09-11 NOTE — ED Triage Notes (Signed)
[facility] of low o2 sat and anemia. Pt has hx of anemia. Pt appears pale, but per pt and her friend, her skin coloration is normal for her. Pt is in no immediate distress. Pt placed on 2 L for 02 of 85%

## 2016-09-11 NOTE — ED Notes (Signed)
Pt O2 sats on 5L VIA [place] remained 85-88%.  Switched pt to non rebreather.  Dondra SpryGail, NP aware.

## 2016-09-12 ENCOUNTER — Inpatient Hospital Stay (HOSPITAL_COMMUNITY): Payer: Managed Care, Other (non HMO)

## 2016-09-12 ENCOUNTER — Encounter (HOSPITAL_COMMUNITY): Payer: Self-pay | Admitting: Internal Medicine

## 2016-09-12 DIAGNOSIS — D749 Methemoglobinemia, unspecified: Secondary | ICD-10-CM | POA: Diagnosis present

## 2016-09-12 DIAGNOSIS — N3289 Other specified disorders of bladder: Secondary | ICD-10-CM | POA: Diagnosis present

## 2016-09-12 DIAGNOSIS — R0902 Hypoxemia: Secondary | ICD-10-CM | POA: Diagnosis not present

## 2016-09-12 DIAGNOSIS — R161 Splenomegaly, not elsewhere classified: Secondary | ICD-10-CM | POA: Diagnosis present

## 2016-09-12 DIAGNOSIS — R0600 Dyspnea, unspecified: Secondary | ICD-10-CM | POA: Diagnosis not present

## 2016-09-12 DIAGNOSIS — Z8249 Family history of ischemic heart disease and other diseases of the circulatory system: Secondary | ICD-10-CM | POA: Diagnosis not present

## 2016-09-12 DIAGNOSIS — J9601 Acute respiratory failure with hypoxia: Secondary | ICD-10-CM | POA: Diagnosis present

## 2016-09-12 DIAGNOSIS — N3010 Interstitial cystitis (chronic) without hematuria: Secondary | ICD-10-CM | POA: Diagnosis present

## 2016-09-12 DIAGNOSIS — D589 Hereditary hemolytic anemia, unspecified: Secondary | ICD-10-CM | POA: Diagnosis present

## 2016-09-12 DIAGNOSIS — M797 Fibromyalgia: Secondary | ICD-10-CM | POA: Diagnosis present

## 2016-09-12 DIAGNOSIS — Z79891 Long term (current) use of opiate analgesic: Secondary | ICD-10-CM | POA: Diagnosis not present

## 2016-09-12 DIAGNOSIS — T398X1A Poisoning by other nonopioid analgesics and antipyretics, not elsewhere classified, accidental (unintentional), initial encounter: Secondary | ICD-10-CM | POA: Diagnosis present

## 2016-09-12 DIAGNOSIS — Z882 Allergy status to sulfonamides status: Secondary | ICD-10-CM | POA: Diagnosis not present

## 2016-09-12 DIAGNOSIS — Z79899 Other long term (current) drug therapy: Secondary | ICD-10-CM | POA: Diagnosis not present

## 2016-09-12 DIAGNOSIS — Z888 Allergy status to other drugs, medicaments and biological substances status: Secondary | ICD-10-CM | POA: Diagnosis not present

## 2016-09-12 DIAGNOSIS — D649 Anemia, unspecified: Secondary | ICD-10-CM | POA: Diagnosis present

## 2016-09-12 DIAGNOSIS — N3090 Cystitis, unspecified without hematuria: Secondary | ICD-10-CM | POA: Diagnosis present

## 2016-09-12 DIAGNOSIS — R17 Unspecified jaundice: Secondary | ICD-10-CM | POA: Diagnosis present

## 2016-09-12 DIAGNOSIS — D592 Drug-induced nonautoimmune hemolytic anemia: Secondary | ICD-10-CM | POA: Diagnosis not present

## 2016-09-12 DIAGNOSIS — D550 Anemia due to glucose-6-phosphate dehydrogenase [G6PD] deficiency: Secondary | ICD-10-CM | POA: Diagnosis present

## 2016-09-12 DIAGNOSIS — F419 Anxiety disorder, unspecified: Secondary | ICD-10-CM | POA: Diagnosis present

## 2016-09-12 DIAGNOSIS — K589 Irritable bowel syndrome without diarrhea: Secondary | ICD-10-CM | POA: Diagnosis present

## 2016-09-12 LAB — COMPREHENSIVE METABOLIC PANEL
ALT: 16 U/L (ref 14–54)
AST: 27 U/L (ref 15–41)
Albumin: 4.4 g/dL (ref 3.5–5.0)
Alkaline Phosphatase: 59 U/L (ref 38–126)
Anion gap: 7 (ref 5–15)
BUN: 12 mg/dL (ref 6–20)
CO2: 27 mmol/L (ref 22–32)
Calcium: 9.4 mg/dL (ref 8.9–10.3)
Chloride: 105 mmol/L (ref 101–111)
Creatinine, Ser: 0.8 mg/dL (ref 0.44–1.00)
GFR calc Af Amer: >60 mL/min (ref >60)
GFR calc non Af Amer: >60 mL/min (ref >60)
Glucose, Bld: 115 mg/dL — ABNORMAL HIGH (ref 65–99)
Potassium: 3.5 mmol/L (ref 3.5–5.1)
Sodium: 139 mmol/L (ref 135–145)
Total Bilirubin: 1.3 mg/dL — ABNORMAL HIGH (ref 0.3–1.2)
Total Protein: 6.9 g/dL (ref 6.5–8.1)

## 2016-09-12 LAB — BLOOD GAS, ARTERIAL
Acid-base deficit: 0.4 mmol/L (ref 0.0–2.0)
Bicarbonate: 22.5 mmol/L (ref 20.0–28.0)
FIO2: 21
O2 Saturation: 93.7 %
Patient temperature: 98.6
pCO2 arterial: 31.5 mmHg — ABNORMAL LOW (ref 32.0–48.0)
pH, Arterial: 7.468 — ABNORMAL HIGH (ref 7.350–7.450)
pO2, Arterial: 96.3 mmHg (ref 83.0–108.0)

## 2016-09-12 LAB — FIBRINOGEN: Fibrinogen: 414 mg/dL (ref 210–475)

## 2016-09-12 LAB — CBC
HCT: 26.2 % — ABNORMAL LOW (ref 36.0–46.0)
Hemoglobin: 8.1 g/dL — ABNORMAL LOW (ref 12.0–15.0)
MCH: 29.8 pg (ref 26.0–34.0)
MCHC: 30.9 g/dL (ref 30.0–36.0)
MCV: 96.3 fL (ref 78.0–100.0)
Platelets: 232 10*3/uL (ref 150–400)
RBC: 2.72 MIL/uL — ABNORMAL LOW (ref 3.87–5.11)
RDW: 17.2 % — ABNORMAL HIGH (ref 11.5–15.5)
WBC: 8.2 10*3/uL (ref 4.0–10.5)

## 2016-09-12 LAB — TSH: TSH: 3.773 u[IU]/mL (ref 0.350–4.500)

## 2016-09-12 LAB — COOXEMETRY PANEL
Carboxyhemoglobin: 0.5 % (ref 0.5–1.5)
Methemoglobin: 6.6 % — ABNORMAL HIGH (ref 0.0–1.5)
O2 Saturation: 94.2 %
Total hemoglobin: 7.8 g/dL — ABNORMAL LOW (ref 12.0–16.0)

## 2016-09-12 LAB — IRON AND TIBC
Iron: 70 ug/dL (ref 28–170)
Saturation Ratios: 16 % (ref 10.4–31.8)
TIBC: 431 ug/dL (ref 250–450)
UIBC: 361 ug/dL

## 2016-09-12 LAB — PROTIME-INR
INR: 1.04
Prothrombin Time: 13.7 seconds (ref 11.4–15.2)

## 2016-09-12 LAB — MAGNESIUM: Magnesium: 2 mg/dL (ref 1.7–2.4)

## 2016-09-12 LAB — LACTATE DEHYDROGENASE: LDH: 381 U/L — ABNORMAL HIGH (ref 98–192)

## 2016-09-12 LAB — HIV ANTIBODY (ROUTINE TESTING W REFLEX): HIV Screen 4th Generation wRfx: NONREACTIVE

## 2016-09-12 LAB — RETICULOCYTES
RBC.: 2.66 MIL/uL — ABNORMAL LOW (ref 3.87–5.11)
Retic Count, Absolute: 308.6 10*3/uL — ABNORMAL HIGH (ref 19.0–186.0)
Retic Ct Pct: 11.6 % — ABNORMAL HIGH (ref 0.4–3.1)

## 2016-09-12 LAB — APTT: aPTT: 22 seconds — ABNORMAL LOW (ref 24–36)

## 2016-09-12 LAB — FERRITIN: Ferritin: 27 ng/mL (ref 11–307)

## 2016-09-12 LAB — TECHNOLOGIST SMEAR REVIEW

## 2016-09-12 LAB — RAPID URINE DRUG SCREEN, HOSP PERFORMED
Amphetamines: NOT DETECTED
Barbiturates: NOT DETECTED
Benzodiazepines: POSITIVE — AB
Cocaine: NOT DETECTED
Opiates: NOT DETECTED
Tetrahydrocannabinol: NOT DETECTED

## 2016-09-12 LAB — SAVE SMEAR

## 2016-09-12 LAB — TROPONIN I
Troponin I: <0.03 ng/mL (ref <0.03)
Troponin I: <0.03 ng/mL (ref <0.03)

## 2016-09-12 LAB — FOLATE: Folate: 10.7 ng/mL (ref >5.9)

## 2016-09-12 LAB — ETHANOL: Alcohol, Ethyl (B): <5 mg/dL (ref <5)

## 2016-09-12 LAB — MRSA PCR SCREENING: MRSA by PCR: POSITIVE — AB

## 2016-09-12 LAB — VITAMIN B12: Vitamin B-12: 266 pg/mL (ref 180–914)

## 2016-09-12 LAB — PHOSPHORUS: Phosphorus: 3.7 mg/dL (ref 2.5–4.6)

## 2016-09-12 MED ORDER — CHLORHEXIDINE GLUCONATE CLOTH 2 % EX PADS
6.0000 | MEDICATED_PAD | Freq: Every day | CUTANEOUS | Status: DC
  Administered 2016-09-13 – 2016-09-14 (×2): 6 via TOPICAL

## 2016-09-12 MED ORDER — HYDROCODONE-ACETAMINOPHEN 5-325 MG PO TABS
1.0000 | ORAL_TABLET | ORAL | Status: DC | PRN
  Administered 2016-09-12: 1 via ORAL
  Administered 2016-09-13: 2 via ORAL
  Administered 2016-09-13 (×3): 1 via ORAL
  Administered 2016-09-14: 2 via ORAL
  Filled 2016-09-12: qty 2
  Filled 2016-09-12 (×2): qty 1
  Filled 2016-09-12: qty 2
  Filled 2016-09-12 (×3): qty 1

## 2016-09-12 MED ORDER — PREGABALIN 75 MG PO CAPS
150.0000 mg | ORAL_CAPSULE | Freq: Two times a day (BID) | ORAL | Status: DC
  Administered 2016-09-12 – 2016-09-14 (×5): 150 mg via ORAL
  Filled 2016-09-12 (×5): qty 2

## 2016-09-12 MED ORDER — ACETAMINOPHEN 325 MG PO TABS
650.0000 mg | ORAL_TABLET | Freq: Four times a day (QID) | ORAL | Status: DC | PRN
  Administered 2016-09-14 (×2): 650 mg via ORAL
  Filled 2016-09-12 (×2): qty 2

## 2016-09-12 MED ORDER — FAMOTIDINE 20 MG PO TABS
20.0000 mg | ORAL_TABLET | Freq: Every day | ORAL | Status: DC
  Administered 2016-09-12 – 2016-09-14 (×3): 20 mg via ORAL
  Filled 2016-09-12 (×3): qty 1

## 2016-09-12 MED ORDER — ALPRAZOLAM 0.5 MG PO TABS
0.5000 mg | ORAL_TABLET | Freq: Two times a day (BID) | ORAL | Status: DC | PRN
  Administered 2016-09-12: 0.5 mg via ORAL
  Filled 2016-09-12: qty 1

## 2016-09-12 MED ORDER — PENTOSAN POLYSULFATE SODIUM 100 MG PO CAPS
100.0000 mg | ORAL_CAPSULE | Freq: Three times a day (TID) | ORAL | Status: DC
  Administered 2016-09-12 – 2016-09-14 (×6): 100 mg via ORAL
  Filled 2016-09-12 (×8): qty 1

## 2016-09-12 MED ORDER — MUPIROCIN 2 % EX OINT
1.0000 "application " | TOPICAL_OINTMENT | Freq: Two times a day (BID) | CUTANEOUS | Status: DC
  Administered 2016-09-12 – 2016-09-14 (×5): 1 via NASAL
  Filled 2016-09-12: qty 22

## 2016-09-12 MED ORDER — SODIUM CHLORIDE 0.9% FLUSH
3.0000 mL | Freq: Two times a day (BID) | INTRAVENOUS | Status: DC
  Administered 2016-09-12 – 2016-09-14 (×3): 3 mL via INTRAVENOUS

## 2016-09-12 MED ORDER — ACETAMINOPHEN 650 MG RE SUPP: 650.0000 mg | Freq: Four times a day (QID) | RECTAL | Status: DC | PRN

## 2016-09-12 MED ORDER — LIDOCAINE HCL 1 % IJ SOLN
INTRAMUSCULAR | Status: AC
  Filled 2016-09-12: qty 20

## 2016-09-12 MED ORDER — ONDANSETRON HCL 4 MG/2ML IJ SOLN
4.0000 mg | Freq: Four times a day (QID) | INTRAMUSCULAR | Status: DC | PRN
  Administered 2016-09-12: 4 mg via INTRAVENOUS
  Filled 2016-09-12: qty 2

## 2016-09-12 MED ORDER — SODIUM CHLORIDE 0.9 % IV SOLN
INTRAVENOUS | Status: DC
  Administered 2016-09-12 – 2016-09-13 (×2): via INTRAVENOUS

## 2016-09-12 MED ORDER — ONDANSETRON HCL 4 MG PO TABS: 4.0000 mg | ORAL_TABLET | Freq: Four times a day (QID) | ORAL | Status: DC | PRN

## 2016-09-12 NOTE — ED Notes (Signed)
Admitting Provider at bedside. 

## 2016-09-12 NOTE — Progress Notes (Signed)
Pt needs to be NPO per Corrie DandyMary, NP.  Pt teary-eyed and wants to eat.  Importance explained.  Pt finally agreed.

## 2016-09-12 NOTE — H&P (Signed)
Mary Orozco ZOX:[id number] DOB: May 24, 1996 DOA: 09/11/2016     PCP: No primary care provider on file.   Outpatient Specialists: none  Patient coming from: home Lives   With family   Chief Complaint: hypoxia  HPI: Mary Orozco is a 20 y.o. female with medical history significant of fibromyalgia interstitial cystitis irritable bowel syndrome, anemia,    Presented with abnormal pulse oximetry reading found today at urgent care  Patient was having UTI symptoms and presented to urgent care vitals were found to be 84% on room air examination was sent to emergency department for further evaluation. Patient denies history of asthma no fevers or chills no chest pain or shortness of breath no nausea vomiting or diarrhea no headache. Patient feels occasionally lightheaded but mostly associated with menstrual periods. She reports 2 weeks ago she also had her oxygenation check and at that point was told was a low blood not much to worry about Regarding pertinent Chronic problems: Patient had in the past vocal cord fatigue treated by EMT   IN ER:  Temp (24hrs), Avg:99.3 F (37.4 C), Min:99.3 F (37.4 C), Max:99.3 F (37.4 C)      RR 15 O2 sat 90% on 50% nonrebreather HR 87 BP 126/78 WBC 9.1 hemoglobin 8.8 which is somewhat down from baseline of 11.31 year ago Creatinine 0.81 Total bili 1.3 D dimer 0.34  acetaminophen of less than 10 CT angiogram showing no evidence of PE clear lungs somewhat visualized splenomegaly Following Medications were ordered in ER: Medications  iopamidol (ISOVUE-370) 76 % injection 100 mL (100 mLs Intravenous Contrast Given 09/11/16 2239)   Hospitalist was called for admission for Persistent hypoxia unclear etiology  Review of Systems:    Pertinent positives include: Dysuria,  change in color of urine  Constitutional:  No weight loss, night sweats, Fevers, chills, fatigue, weight loss  HEENT:  No headaches, Difficulty swallowing,Tooth/dental  problems,Sore throat,  No sneezing, itching, ear ache, nasal congestion, post nasal drip,  Cardio-vascular:  No chest pain, Orthopnea, PND, anasarca, dizziness, palpitations.no Bilateral lower extremity swelling  GI:  No heartburn, indigestion, abdominal pain, nausea, vomiting, diarrhea, change in bowel habits, loss of appetite, melena, blood in stool, hematemesis Resp:  no shortness of breath at rest. No dyspnea on exertion, No excess mucus, no productive cough, No non-productive cough, No coughing up of blood.No change in color of mucus.No wheezing. Skin:  no rash or lesions. No jaundice GU:  no dysuria,, no urgency or frequency. No straining to urinate.  No flank pain.  Musculoskeletal:  No joint pain or no joint swelling. No decreased range of motion. No back pain.  Psych:  No change in mood or affect. No depression or anxiety. No memory loss.  Neuro: no localizing neurological complaints, no tingling, no weakness, no double vision, no gait abnormality, no slurred speech, no confusion  As per HPI otherwise 10 point review of systems negative.   Past Medical History: Past Medical History:  Diagnosis Date  . Cystitis   . IBS (irritable bowel syndrome)   . Migraine   . Renal disorder    kidney stones   Past Surgical History:  Procedure Laterality Date  . WISDOM TOOTH EXTRACTION       Social History:  Ambulatory   independently      reports that she has never smoked. She does not have any smokeless tobacco history on file. She reports that she does not drink alcohol. Her drug history is not on file.  Allergies:   Allergies  Allergen Reactions  . Diflucan [Fluconazole] Rash  . Sulfa Antibiotics Rash       Family History:  Family History  Problem Relation Age of Onset  . Diabetes Other   . CAD Other     Medications: Prior to Admission medications   Medication Sig Start Date End Date Taking? Authorizing Provider  ALPRAZolam Prudy Feeler(XANAX) 0.5 MG tablet Take 0.5  mg by mouth 2 (two) times daily as needed for anxiety.   Yes Historical Provider, MD  NALTREXONE HCL PO Take 1.5 mg by mouth at bedtime.    Yes Historical Provider, MD  phenazopyridine (PYRIDIUM) 95 MG tablet Take 95 mg by mouth every 4 (four) hours as needed for pain.   Yes Historical Provider, MD  pregabalin (LYRICA) 150 MG capsule Take 150 mg by mouth 2 (two) times daily.   Yes Historical Provider, MD  ranitidine (ZANTAC) 150 MG tablet Take 150 mg by mouth 2 (two) times daily.   Yes Historical Provider, MD  cephALEXin (KEFLEX) 500 MG capsule Take 1 capsule (500 mg total) by mouth 4 (four) times daily. Patient not taking: Reported on 09/11/2016 01/12/15   Raeford RazorStephen Kohut, MD  medroxyPROGESTERone (DEPO-PROVERA) 150 MG/ML injection  12/14/14   Historical Provider, MD  mupirocin ointment (BACTROBAN) 2 % Apply 1 application topically daily.  01/05/15   Historical Provider, MD  Naltrexone POWD Take 1.5 mg by mouth at bedtime.    Historical Provider, MD  ondansetron (ZOFRAN) 4 MG tablet Take 1 tablet (4 mg total) by mouth every 6 (six) hours. Patient not taking: Reported on 09/11/2016 01/12/15   Raeford RazorStephen Kohut, MD  oxyCODONE-acetaminophen (PERCOCET/ROXICET) 5-325 MG per tablet Take 1 tablet by mouth every 4 (four) hours as needed for severe pain. Patient not taking: Reported on 09/11/2016 01/12/15   Raeford RazorStephen Kohut, MD    Physical Exam: Patient Vitals for the past 24 hrs:  BP Temp Temp src Pulse Resp SpO2 Height Weight  09/11/16 2230 126/78 - - 87 15 90 % - -  09/11/16 2100 140/79 - - 109 11 (!) 87 % - -  09/11/16 2030 124/83 - - 92 12 (!) 89 % - -  09/11/16 1927 145/89 99.3 F (37.4 C) Oral 116 19 (!) 83 % 5\' 4"  (1.626 m) 90.8 kg (200 lb 4 oz)    1. General:  in No Acute distress 2. Psychological: Alert and   Oriented 3. Head/ENT:     Dry Mucous Membranes                          Head Non traumatic, neck supple                          Normal  Dentition  4. SKIN: normal   Skin turgor,  Skin clean Dry and  intact no rash, Pale appearing 5. Heart: Regular rate and rhythm no Murmur, Rub or gallop 6. Lungs: Clear to auscultation bilaterally, no wheezes or crackles   7. Abdomen: Soft, non-tender, Non distended 8. Lower extremities: no clubbing, cyanosis, or edema 9. Neurologically Grossly intact, moving all 4 extremities equally  10. MSK: Normal range of motion   body mass index is 34.37 kg/m.  Labs on Admission:   Labs on Admission: I have personally reviewed following labs and imaging studies  CBC:  Recent Labs Lab 09/11/16 2002  WBC 9.1  NEUTROABS 7.0  HGB 8.8*  HCT 27.7*  MCV 96.9  PLT 241   Basic Metabolic Panel:  Recent Labs Lab 09/11/16 2002  NA 138  K 3.9  CL 106  CO2 25  GLUCOSE 106*  BUN 12  CREATININE 0.81  CALCIUM 9.9   GFR: Estimated Creatinine Clearance: 119.8 mL/min (by C-G formula based on SCr of 0.81 mg/dL). Liver Function Tests:  Recent Labs Lab 09/11/16 2002  AST 29  ALT 17  ALKPHOS 62  BILITOT 1.3*  PROT 7.3  ALBUMIN 4.6   No results for input(s): LIPASE, AMYLASE in the last 168 hours. No results for input(s): AMMONIA in the last 168 hours. Coagulation Profile: No results for input(s): INR, PROTIME in the last 168 hours. Cardiac Enzymes: No results for input(s): CKTOTAL, CKMB, CKMBINDEX, TROPONINI in the last 168 hours. BNP (last 3 results) No results for input(s): PROBNP in the last 8760 hours. HbA1C: No results for input(s): HGBA1C in the last 72 hours. CBG: No results for input(s): GLUCAP in the last 168 hours. Lipid Profile: No results for input(s): CHOL, HDL, LDLCALC, TRIG, CHOLHDL, LDLDIRECT in the last 72 hours. Thyroid Function Tests: No results for input(s): TSH, T4TOTAL, FREET4, T3FREE, THYROIDAB in the last 72 hours. Anemia Panel: No results for input(s): VITAMINB12, FOLATE, FERRITIN, TIBC, IRON, RETICCTPCT in the last 72 hours. Urine analysis:    Component Value Date/Time   COLORURINE ORANGE (A) 09/11/2016 2054    APPEARANCEUR CLOUDY (A) 09/11/2016 2054   LABSPEC 1.010 09/11/2016 2054   PHURINE 7.5 09/11/2016 2054   GLUCOSEU NEGATIVE 09/11/2016 2054   HGBUR NEGATIVE 09/11/2016 2054   BILIRUBINUR NEGATIVE 09/11/2016 2054   KETONESUR NEGATIVE 09/11/2016 2054   PROTEINUR NEGATIVE 09/11/2016 2054   UROBILINOGEN 1.0 01/12/2015 1257   NITRITE POSITIVE (A) 09/11/2016 2054   LEUKOCYTESUR LARGE (A) 09/11/2016 2054   Sepsis Labs: @LABRCNTIP (procalcitonin:4,lacticidven:4) )No results found for this or any previous visit (from the past 240 hour(s)).     UA  evidence of UTI  Nitrite positive  No results found for: HGBA1C  Estimated Creatinine Clearance: 119.8 mL/min (by C-G formula based on SCr of 0.81 mg/dL).  BNP (last 3 results) No results for input(s): PROBNP in the last 8760 hours.   ECG REPORT Not obtained  [facility] Weights   09/11/16 1927  Weight: 90.8 kg (200 lb 4 oz)     Cultures:    Component Value Date/Time   SDES URINE, CLEAN CATCH 01/12/2015 1307   SPECREQUEST NONE 01/12/2015 1307   CULT  01/12/2015 1307    INSIGNIFICANT GROWTH Performed at [facility]    REPTSTATUS 01/14/2015 FINAL 01/12/2015 1307     Radiological Exams on Admission: Dg Chest 2 View  Result Date: 09/11/2016 CLINICAL DATA:  Hypoxia EXAM: CHEST  2 VIEW COMPARISON:  None. FINDINGS: The heart size and mediastinal contours are within normal limits. Both lungs are clear. The visualized skeletal structures are unremarkable. IMPRESSION: No active cardiopulmonary disease. Electronically Signed   By: Jasmine Pang M.D.   On: 09/11/2016 21:14   Ct Angio Chest Pe W/cm &/or Wo Cm  Result Date: 09/11/2016 CLINICAL DATA:  20 y/o F; history of anemia and irritable bowel syndrome with low oxygen saturation. EXAM: CT ANGIOGRAPHY CHEST WITH CONTRAST TECHNIQUE: Multidetector CT imaging of the chest was performed using the standard protocol during bolus administration of intravenous contrast. Multiplanar CT image  reconstructions and MIPs were obtained to evaluate the vascular anatomy. CONTRAST:  100 cc Isovue 370 COMPARISON:  09/11/2016 chest radiograph FINDINGS: Cardiovascular: Satisfactory opacification of the pulmonary  arteries to the segmental level. No evidence of pulmonary embolism. Normal heart size. No pericardial effusion. Mediastinum/Nodes: No enlarged mediastinal, hilar, or axillary lymph nodes. Thyroid gland, trachea, and esophagus demonstrate no significant findings. Lungs/Pleura: Lungs are clear. No pleural effusion or pneumothorax. Upper Abdomen: Partially visualize splenomegaly. Musculoskeletal: No chest wall abnormality. No acute or significant osseous findings. Review of the MIP images confirms the above findings. IMPRESSION: 1. No evidence of pulmonary embolus. 2. Clear lungs. 3. Partially visualize splenomegaly. Electronically Signed   By: Mitzi HansenLance  Furusawa-Stratton M.D.   On: 09/11/2016 22:59    Chart has been reviewed    Assessment/Plan   20 y.o. female with medical history significant of fibromyalgia interstitial cystitis irritable bowel syndrome, anemia, being admitted for persistent hypoxia  Present on Admission: . Hypoxia - discuss case with pulmonology most likely etiology secondary to shunt. We'll obtain echogram to evaluate for PFO. Appreciate  pulmonology input. Even severity of hypoxia will admit to ICU although patient is minimally symptomatic but required 100% nonrebreather to maintain oxygen saturation . Symptomatic anemia - obtain anemia panel and transfuse for hemoglobin below 7.0 . Splenomegaly - will obtain abdominal ultrasound to evaluate better extent of splenomegaly. Evaluate for any evidence of hemolysis obtain LDH heptoglobin blood smear  Other plan as per orders.  DVT prophylaxis:  SCD     Code Status:  FULL CODE  as per patient    Family Communication:   Family   at  Bedside  plan of care was discussed with father, mother  Disposition Plan:     To home once  workup is complete and patient is stable                              Consults called: pulmonology    Admission status:   obs   Level of care      SDU      I have spent a total of 56 min on this admission  Aeron Lheureux 09/12/2016, 12:56 AM  [facility]  Pager [phone]   after 2 AM please page floor coverage PA If 7AM-7PM, please contact the day team taking care of the patient  [email or web address]  Password [email or web address]

## 2016-09-12 NOTE — Progress Notes (Signed)
PROGRESS NOTE  Council MechanicMeredith M Wold ZOX:[id number] DOB: 25-Dec-1995 DOA: 09/11/2016 PCP: No primary care provider on file.  HPI/Recap of past 5324 hours: 20 year old female with essentially no past medical history of this and some recent dysuria and pelvic pain presented to the urgent care on 12/1 with complaints of dysuria that time was found to actually be hypoxic with oxygen saturations of 84% on room air. Patient denied any history of asthma or wheezing. In the emergency room, she is found have a hemoglobin of 8.8, down from a baseline of 11.3 a year ago. She is also noted to be requiring more and more oxygen and actually acquired to 50% nonrebreather. D-dimer and chest CT and chest x-ray were all unremarkable.  Patient's other labs noted an elevated LDH, splenomegaly seen on chest CT and indirect hyperbilirubinemia noting a mild hemolysis. Patient put on Pyridium for her UTI symptoms and suspect that she has underlying G6PD deficiency. In addition, there was suspicion the patient may have possible shunt or pulmonary hypertension as an underlying cause. Patient was placed in the stepdown unit on the hospitalist service.  This morning, patient felt very anxious to the point where she wanted to leave. She was given ativan and is now resting comfortably.  Down to 6 lit [place]  Assessment/Plan: Acute Resp Failure with hypoxia: Suspected G6PD deficiency given labs and history.  Also, may have underlying shunt.  Supportive measures. Can't check for G^PD until as outpatient.  Check bubble study.    Anxiety: prn ativan  UTI: IV fluids to flush out pyridium, replace with elmiron  Code Status: Full   Family Communication: Spoke with fiance and mom at bedside   Disposition Plan: cont in Stepdown    Consultants:  Pulmonary   Procedures:  Bubble study echo ordered   Antimicrobials:  None   DVT prophylaxis:  SCD   Objective: Vitals:   09/12/16 1020 09/12/16 1152 09/12/16 1200 09/12/16 1300    BP: 116/74 (!) 142/82  118/72  Pulse: 77 (!) 105  90  Resp: 15 17  19   Temp:   99 F (37.2 C)   TempSrc:   Oral   SpO2: 93% 93%  94%  Weight:      Height:       No intake or output data in the 24 hours ending 09/12/16 1506 Filed Weights   09/11/16 1927 09/12/16 0300  Weight: 90.8 kg (200 lb 4 oz) 88.6 kg (195 lb 5.2 oz)    Exam:   General:  Resting comfortably   Cardiovascular: RRR S1S2   Respiratory: Clear to auscultation bilaterally   Abdomen: soft, ND, +BS   Musculoskeletal: No clubbing or cyanosis or edema   Skin: No skin breaks or tears or lesions  Psychiatry: No evidence of psychoses    Data Reviewed: CBC:  Recent Labs Lab 09/11/16 2002 09/12/16 0338  WBC 9.1 8.2  NEUTROABS 7.0  --   HGB 8.8* 8.1*  HCT 27.7* 26.2*  MCV 96.9 96.3  PLT 241 232   Basic Metabolic Panel:  Recent Labs Lab 09/11/16 2002 09/12/16 0338  NA 138 139  K 3.9 3.5  CL 106 105  CO2 25 27  GLUCOSE 106* 115*  BUN 12 12  CREATININE 0.81 0.80  CALCIUM 9.9 9.4  MG  --  2.0  PHOS  --  3.7   GFR: Estimated Creatinine Clearance: 119.9 mL/min (by C-G formula based on SCr of 0.8 mg/dL). Liver Function Tests:  Recent Labs Lab 09/11/16 2002 09/12/16  0338  AST 29 27  ALT 17 16  ALKPHOS 62 59  BILITOT 1.3* 1.3*  PROT 7.3 6.9  ALBUMIN 4.6 4.4   No results for input(s): LIPASE, AMYLASE in the last 168 hours. No results for input(s): AMMONIA in the last 168 hours. Coagulation Profile:  Recent Labs Lab 09/12/16 1300  INR 1.04   Cardiac Enzymes:  Recent Labs Lab 09/12/16 1300  TROPONINI <0.03   BNP (last 3 results) No results for input(s): PROBNP in the last 8760 hours. HbA1C: No results for input(s): HGBA1C in the last 72 hours. CBG: No results for input(s): GLUCAP in the last 168 hours. Lipid Profile: No results for input(s): CHOL, HDL, LDLCALC, TRIG, CHOLHDL, LDLDIRECT in the last 72 hours. Thyroid Function Tests:  Recent Labs  09/12/16 0338  TSH  3.773   Anemia Panel:  Recent Labs  09/12/16 0025  VITAMINB12 266  FOLATE 10.7  FERRITIN 27  TIBC 431  IRON 70  RETICCTPCT 11.6*   Urine analysis:    Component Value Date/Time   COLORURINE ORANGE (A) 09/11/2016 2054   APPEARANCEUR CLOUDY (A) 09/11/2016 2054   LABSPEC 1.010 09/11/2016 2054   PHURINE 7.5 09/11/2016 2054   GLUCOSEU NEGATIVE 09/11/2016 2054   HGBUR NEGATIVE 09/11/2016 2054   BILIRUBINUR NEGATIVE 09/11/2016 2054   KETONESUR NEGATIVE 09/11/2016 2054   PROTEINUR NEGATIVE 09/11/2016 2054   UROBILINOGEN 1.0 01/12/2015 1257   NITRITE POSITIVE (A) 09/11/2016 2054   LEUKOCYTESUR LARGE (A) 09/11/2016 2054   Sepsis Labs: @LABRCNTIP (procalcitonin:4,lacticidven:4)  ) Recent Results (from the past 240 hour(s))  MRSA PCR Screening     Status: Abnormal   Collection Time: 09/12/16  6:58 AM  Result Value Ref Range Status   MRSA by PCR POSITIVE (A) NEGATIVE Final    Comment:        The GeneXpert MRSA Assay (FDA approved for NASAL specimens only), is one component of a comprehensive MRSA colonization surveillance program. It is not intended to diagnose MRSA infection nor to guide or monitor treatment for MRSA infections. RESULT CALLED TO, READ BACK BY AND VERIFIED WITH: ARNOLD,A AT 1220 ON [id number] BY HOOKER,B       Studies: Dg Chest 2 View  Result Date: 09/11/2016 CLINICAL DATA:  Hypoxia EXAM: CHEST  2 VIEW COMPARISON:  None. FINDINGS: The heart size and mediastinal contours are within normal limits. Both lungs are clear. The visualized skeletal structures are unremarkable. IMPRESSION: No active cardiopulmonary disease. Electronically Signed   By: Jasmine PangKim  Fujinaga M.D.   On: 09/11/2016 21:14   Ct Angio Chest Pe W/cm &/or Wo Cm  Result Date: 09/11/2016 CLINICAL DATA:  20 y/o F; history of anemia and irritable bowel syndrome with low oxygen saturation. EXAM: CT ANGIOGRAPHY CHEST WITH CONTRAST TECHNIQUE: Multidetector CT imaging of the chest was performed using the  standard protocol during bolus administration of intravenous contrast. Multiplanar CT image reconstructions and MIPs were obtained to evaluate the vascular anatomy. CONTRAST:  100 cc Isovue 370 COMPARISON:  09/11/2016 chest radiograph FINDINGS: Cardiovascular: Satisfactory opacification of the pulmonary arteries to the segmental level. No evidence of pulmonary embolism. Normal heart size. No pericardial effusion. Mediastinum/Nodes: No enlarged mediastinal, hilar, or axillary lymph nodes. Thyroid gland, trachea, and esophagus demonstrate no significant findings. Lungs/Pleura: Lungs are clear. No pleural effusion or pneumothorax. Upper Abdomen: Partially visualize splenomegaly. Musculoskeletal: No chest wall abnormality. No acute or significant osseous findings. Review of the MIP images confirms the above findings. IMPRESSION: 1. No evidence of pulmonary embolus. 2. Clear lungs. 3. Partially  visualize splenomegaly. Electronically Signed   By: Mitzi Hansen M.D.   On: 09/11/2016 22:59   US Abdomen Complete  Result Date: 09/12/2016 CLINICAL DATA:  Splenomegaly.  Low oxygen saturations. EXAM: ABDOMEN ULTRASOUND COMPLETE COMPARISON:  Chest CT 09/11/2016 FINDINGS: Gallbladder: No gallstones or wall thickening visualized. No sonographic Murphy sign noted by sonographer. Common bile duct: Diameter: 0.5 cm. Liver: No focal lesion identified. Within normal limits in parenchymal echogenicity. IVC: No abnormality visualized. Pancreas: Visualized portion unremarkable. Pancreatic tail is obscured by gas. Spleen: Measures 13.2 x 5.7 x 13.4 cm. Calculated splenic volume is 504 mL. No focal splenic lesion. Right Kidney: Length: 13.2 cm. Echogenicity within normal limits. No mass or hydronephrosis visualized. Left Kidney: Length: 13.7 cm. Echogenicity within normal limits. No mass or hydronephrosis visualized. Abdominal aorta: No aneurysm visualized. Other findings: None. IMPRESSION: Mild splenomegaly. Calculated  splenic volume is 504 mL. Otherwise, normal abdominal ultrasound. Electronically Signed   By: Richarda Overlie M.D.   On: 09/12/2016 09:56    Scheduled Meds: . [START ON 09/13/2016] Chlorhexidine Gluconate Cloth  6 each Topical Q0600  . famotidine  20 mg Oral Daily  . mupirocin ointment  1 application Nasal BID  . pentosan polysulfate  100 mg Oral TID  . pregabalin  150 mg Oral BID  . sodium chloride flush  3 mL Intravenous Q12H    Continuous Infusions: . sodium chloride       LOS: 0 days     Hollice Espy, MD [facility] Pager [phone]  If 7PM-7AM, please contact night-coverage [email or web address] Password [email or web address] 09/12/2016, 3:06 PM

## 2016-09-12 NOTE — Consult Note (Addendum)
Name: Mary Orozco MRN: [id number] DOB: 1996-08-14    ADMISSION DATE:  09/11/2016 CONSULTATION DATE:  09/12/2016  REFERRING MD :  Dr. Roel Cluck  CHIEF COMPLAINT:  Hypoxemia  HISTORY OF PRESENT ILLNESS:   3 CF with h/o fibromyalgia and interstitial cystitis who presented with complaints of dysuria and pelvic pain.  She was seen in the urgent care and was found to have an oxygen saturation of 85%.  She was placed on a non-re breather and sent right to the ED, in the ED she was left on the non-re breather but sats never got above 93%.  She states that 2 weeks ago she had her menstrual period (her flow is usually very heavy) with her increased pelvic pain over the last 2 weeks she has been taking her pyridium much more than usual (upto 5 times per day).  She denies and SOB, DOE, chest pain, n/v/d.  She has not had any change in her exercise tolerance.  She is a never smoker. She has never had lung issues. No family h/o lung issues.   PAST MEDICAL HISTORY :   has a past medical history of Cystitis; IBS (irritable bowel syndrome); Migraine; and Renal disorder.  has a past surgical history that includes Wisdom tooth extraction. Prior to Admission medications   Medication Sig Start Date End Date Taking? Authorizing Provider  ALPRAZolam Duanne Moron) 0.5 MG tablet Take 0.5 mg by mouth 2 (two) times daily as needed for anxiety.   Yes Historical Provider, MD  NALTREXONE HCL PO Take 1.5 mg by mouth at bedtime.    Yes Historical Provider, MD  phenazopyridine (PYRIDIUM) 95 MG tablet Take 95 mg by mouth every 4 (four) hours as needed for pain.   Yes Historical Provider, MD  pregabalin (LYRICA) 150 MG capsule Take 150 mg by mouth 2 (two) times daily.   Yes Historical Provider, MD  ranitidine (ZANTAC) 150 MG tablet Take 150 mg by mouth 2 (two) times daily.   Yes Historical Provider, MD  cephALEXin (KEFLEX) 500 MG capsule Take 1 capsule (500 mg total) by mouth 4 (four) times daily. Patient not taking:  Reported on 09/11/2016 01/12/15   Virgel Manifold, MD  medroxyPROGESTERone (DEPO-PROVERA) 150 MG/ML injection  12/14/14   Historical Provider, MD  mupirocin ointment (BACTROBAN) 2 % Apply 1 application topically daily.  01/05/15   Historical Provider, MD  Naltrexone POWD Take 1.5 mg by mouth at bedtime.    Historical Provider, MD  ondansetron (ZOFRAN) 4 MG tablet Take 1 tablet (4 mg total) by mouth every 6 (six) hours. Patient not taking: Reported on 09/11/2016 01/12/15   Virgel Manifold, MD  oxyCODONE-acetaminophen (PERCOCET/ROXICET) 5-325 MG per tablet Take 1 tablet by mouth every 4 (four) hours as needed for severe pain. Patient not taking: Reported on 09/11/2016 01/12/15   Virgel Manifold, MD   Allergies  Allergen Reactions  . Diflucan [Fluconazole] Rash  . Sulfa Antibiotics Rash    FAMILY HISTORY:  family history includes CAD in her other; Diabetes in her other. SOCIAL HISTORY:  reports that she has never smoked. She has never used smokeless tobacco. She reports that she does not drink alcohol.  REVIEW OF SYSTEMS:   Constitutional: Negative for fever, chills, weight loss, malaise/fatigue and diaphoresis.  HENT: Negative for hearing loss, ear pain, nosebleeds, congestion, sore throat, neck pain, tinnitus and ear discharge.   Eyes: Negative for blurred vision, double vision, photophobia, pain, discharge and redness.  Respiratory: Negative for cough, hemoptysis, sputum production, shortness of breath, wheezing  and stridor.   Cardiovascular: Negative for chest pain, palpitations, orthopnea, claudication, leg swelling and PND.  Gastrointestinal: Negative for heartburn, nausea, vomiting, abdominal pain, diarrhea, constipation, blood in stool and melena.  Genitourinary: Negative for dysuria, urgency, frequency, hematuria and flank pain.  Musculoskeletal: Negative for myalgias, back pain, joint pain and falls.  Skin: Negative for itching and rash.  Neurological: Negative for dizziness, tingling, tremors,  sensory change, speech change, focal weakness, seizures, loss of consciousness, weakness and headaches.  Endo/Heme/Allergies: Negative for environmental allergies and polydipsia. Does not bruise/bleed easily.  SUBJECTIVE:   VITAL SIGNS: Temp:  [99.3 F (37.4 C)] 99.3 F (37.4 C) (12/01 1927) Pulse Rate:  [87-116] 90 (12/02 0130) Resp:  [11-22] 22 (12/02 0130) BP: (114-145)/(71-89) 130/75 (12/02 0130) SpO2:  [83 %-94 %] 93 % (12/02 0130) Weight:  [90.8 kg (200 lb 4 oz)] 90.8 kg (200 lb 4 oz) (12/01 1927)  PHYSICAL EXAMINATION: General:  NAD, obese Neuro:  CN II-XII intact HEENT:  Pale conjunctiva Cardiovascular:  Tachycardiac, no m/r/g Lungs:  CTA b/l no w/r/r Abdomen:  Soft, non tender, normal bowel sounds Musculoskeletal:  Normal bulk and tone Skin:  Pale   Recent Labs Lab 09/11/16 2002  NA 138  K 3.9  CL 106  CO2 25  BUN 12  CREATININE 0.81  GLUCOSE 106*    Recent Labs Lab 09/11/16 2002  HGB 8.8*  HCT 27.7*  WBC 9.1  PLT 241   Dg Chest 2 View  Result Date: 09/11/2016 CLINICAL DATA:  Hypoxia EXAM: CHEST  2 VIEW COMPARISON:  None. FINDINGS: The heart size and mediastinal contours are within normal limits. Both lungs are clear. The visualized skeletal structures are unremarkable. IMPRESSION: No active cardiopulmonary disease. Electronically Signed   By: Donavan Foil M.D.   On: 09/11/2016 21:14   Ct Angio Chest Pe W/cm &/or Wo Cm  Result Date: 09/11/2016 CLINICAL DATA:  20 y/o F; history of anemia and irritable bowel syndrome with low oxygen saturation. EXAM: CT ANGIOGRAPHY CHEST WITH CONTRAST TECHNIQUE: Multidetector CT imaging of the chest was performed using the standard protocol during bolus administration of intravenous contrast. Multiplanar CT image reconstructions and MIPs were obtained to evaluate the vascular anatomy. CONTRAST:  100 cc Isovue 370 COMPARISON:  09/11/2016 chest radiograph FINDINGS: Cardiovascular: Satisfactory opacification of the pulmonary  arteries to the segmental level. No evidence of pulmonary embolism. Normal heart size. No pericardial effusion. Mediastinum/Nodes: No enlarged mediastinal, hilar, or axillary lymph nodes. Thyroid gland, trachea, and esophagus demonstrate no significant findings. Lungs/Pleura: Lungs are clear. No pleural effusion or pneumothorax. Upper Abdomen: Partially visualize splenomegaly. Musculoskeletal: No chest wall abnormality. No acute or significant osseous findings. Review of the MIP images confirms the above findings. IMPRESSION: 1. No evidence of pulmonary embolus. 2. Clear lungs. 3. Partially visualize splenomegaly. Electronically Signed   By: Kristine Garbe M.D.   On: 09/11/2016 22:59   .Cooxemetry Panel (carboxy, met, total hgb, O2 sat)  Order: [id number]  Status:  Final result  Visible to patient:  No (Not Released)  Next appt:  None   Ref Range & Units 02:00  Total hemoglobin 12.0 - 16.0 g/dL 7.8    O2 Saturation % 94.2   Carboxyhemoglobin 0.5 - 1.5 % 0.5   Methemoglobin 0.0 - 1.5 % 6.6    Resulting Agency  [facility]    Specimen Collected: 09/12/16 02:00 Last Resulted: 09/12/16 02:33           Blood gas, arterial ([facility] & [facility] ONLY)  Order: [id number]  Status:  Final result  Visible to patient:  No (Not Released)  Next appt:  None  Newer results are available. Click to view them now.   Ref Range & Units 02:00  FIO2  21.00   pH, Arterial 7.350 - 7.450 7.468    pCO2 arterial 32.0 - 48.0 mmHg 31.5    pO2, Arterial 83.0 - 108.0 mmHg 96.3   Bicarbonate 20.0 - 28.0 mmol/L 22.5   Acid-base deficit 0.0 - 2.0 mmol/L 0.4   O2 Saturation % 93.7   Patient temperature  98.6   Collection site  LEFT RADIAL   Drawn by  Roswell Nickel, MD   Sample type  ARTERIAL DRAW   Allens test (pass/fail) PASS PASS          ASSESSMENT / PLAN:  20yo CF with UTI and acute hypoxemia 2/2 methemoglobinemia (level 6.6%) from pyridium OD.  Given her acute anemia (11->8), elevated LDH, splenomegaly  and indirect hyperbilirubinemia indicate mild hemolysis which may suggest an underlying G6PD deficiency.    - Supplemental O2 4L [place] - Hold pyridium - Alternative pain medication is suggested. - TTE with bubble study to r/o PH and/or shunting - CTPE negative for PE or interstitial lung disease.  - Do not draw G6PD level now - Outpatient G6PD level to be drawn - If patient decompensates call pulmonary for IV methylene blue infusion, though at this time supportive care is most appropriate  Total critical care time: 45 min  Critical care time was exclusive of separately billable procedures and treating other patients.  Critical care was necessary to treat or prevent imminent or life-threatening deterioration.  Critical care was time spent personally by me on the following activities: development of treatment plan with patient and/or surrogate as well as nursing, discussions with consultants, evaluation of patient's response to treatment, examination of patient, obtaining history from patient or surrogate, ordering and performing treatments and interventions, ordering and review of laboratory studies, ordering and review of radiographic studies, pulse oximetry and re-evaluation of patient's condition.   Meribeth Mattes, DO., MS [facility]    Pulmonary and [place] Pager: [phone]  09/12/2016, 2:40 AM

## 2016-09-12 NOTE — Progress Notes (Signed)
Name: Mary Orozco MRN: [id number] DOB: 1996-03-17    ADMISSION DATE:  09/11/2016 CONSULTATION DATE:  09/12/2016  REFERRING MD :  Dr. Roel Cluck  CHIEF COMPLAINT:  Hypoxemia  HISTORY OF PRESENT ILLNESS:   48 CF with h/o fibromyalgia and interstitial cystitis who presented with complaints of dysuria and pelvic pain.  She was seen in the urgent care and was found to have an oxygen saturation of 85%.  She was placed on a non-re breather and sent right to the ED, in the ED she was left on the non-re breather but sats never got above 93%.  She states that 2 weeks ago she had her menstrual period (her flow is usually very heavy) with her increased pelvic pain over the last 2 weeks she has been taking her pyridium much more than usual (upto 5 times per day).  She denies and SOB, DOE, chest pain, n/v/d.  She has not had any change in her exercise tolerance.  She is a never smoker. She has never had lung issues. No family h/o lung issues.    SUBJECTIVE:  Feels better. Wants to go home.  NRBM switched to HFNC. Currently on 8-10 L.   VITAL SIGNS: Temp:  [97.7 F (36.5 C)-99.3 F (37.4 C)] 99 F (37.2 C) (12/02 1200) Pulse Rate:  [77-116] 105 (12/02 1152) Resp:  [11-22] 17 (12/02 1152) BP: (114-145)/(71-89) 142/82 (12/02 1152) SpO2:  [83 %-94 %] 93 % (12/02 1152) Weight:  [88.6 kg (195 lb 5.2 oz)-90.8 kg (200 lb 4 oz)] 88.6 kg (195 lb 5.2 oz) (12/02 0300)  PHYSICAL EXAMINATION: General:  NAD, obese. pale Neuro:  CN II-XII intact HEENT:  Pale conjunctiva Cardiovascular:  Tachycardiac, no m/r/g Lungs:  CTA b/l no w/r/r Abdomen:  Soft, non tender, normal bowel sounds Musculoskeletal:  Normal bulk and tone Skin:  Pale   Recent Labs Lab 09/11/16 2002 09/12/16 0338  NA 138 139  K 3.9 3.5  CL 106 105  CO2 25 27  BUN 12 12  CREATININE 0.81 0.80  GLUCOSE 106* 115*    Recent Labs Lab 09/11/16 2002 09/12/16 0338  HGB 8.8* 8.1*  HCT 27.7* 26.2*  WBC 9.1 8.2  PLT 241 232     Dg Chest 2 View  Result Date: 09/11/2016 CLINICAL DATA:  Hypoxia EXAM: CHEST  2 VIEW COMPARISON:  None. FINDINGS: The heart size and mediastinal contours are within normal limits. Both lungs are clear. The visualized skeletal structures are unremarkable. IMPRESSION: No active cardiopulmonary disease. Electronically Signed   By: Donavan Foil M.D.   On: 09/11/2016 21:14   Ct Angio Chest Pe W/cm &/or Wo Cm  Result Date: 09/11/2016 CLINICAL DATA:  20 y/o F; history of anemia and irritable bowel syndrome with low oxygen saturation. EXAM: CT ANGIOGRAPHY CHEST WITH CONTRAST TECHNIQUE: Multidetector CT imaging of the chest was performed using the standard protocol during bolus administration of intravenous contrast. Multiplanar CT image reconstructions and MIPs were obtained to evaluate the vascular anatomy. CONTRAST:  100 cc Isovue 370 COMPARISON:  09/11/2016 chest radiograph FINDINGS: Cardiovascular: Satisfactory opacification of the pulmonary arteries to the segmental level. No evidence of pulmonary embolism. Normal heart size. No pericardial effusion. Mediastinum/Nodes: No enlarged mediastinal, hilar, or axillary lymph nodes. Thyroid gland, trachea, and esophagus demonstrate no significant findings. Lungs/Pleura: Lungs are clear. No pleural effusion or pneumothorax. Upper Abdomen: Partially visualize splenomegaly. Musculoskeletal: No chest wall abnormality. No acute or significant osseous findings. Review of the MIP images confirms the above findings. IMPRESSION: 1.  No evidence of pulmonary embolus. 2. Clear lungs. 3. Partially visualize splenomegaly. Electronically Signed   By: Kristine Garbe M.D.   On: 09/11/2016 22:59   US Abdomen Complete  Result Date: 09/12/2016 CLINICAL DATA:  Splenomegaly.  Low oxygen saturations. EXAM: ABDOMEN ULTRASOUND COMPLETE COMPARISON:  Chest CT 09/11/2016 FINDINGS: Gallbladder: No gallstones or wall thickening visualized. No sonographic Murphy sign noted by  sonographer. Common bile duct: Diameter: 0.5 cm. Liver: No focal lesion identified. Within normal limits in parenchymal echogenicity. IVC: No abnormality visualized. Pancreas: Visualized portion unremarkable. Pancreatic tail is obscured by gas. Spleen: Measures 13.2 x 5.7 x 13.4 cm. Calculated splenic volume is 504 mL. No focal splenic lesion. Right Kidney: Length: 13.2 cm. Echogenicity within normal limits. No mass or hydronephrosis visualized. Left Kidney: Length: 13.7 cm. Echogenicity within normal limits. No mass or hydronephrosis visualized. Abdominal aorta: No aneurysm visualized. Other findings: None. IMPRESSION: Mild splenomegaly. Calculated splenic volume is 504 mL. Otherwise, normal abdominal ultrasound. Electronically Signed   By: Markus Daft M.D.   On: 09/12/2016 09:56   .Cooxemetry Panel (carboxy, met, total hgb, O2 sat)  Order: [id number]  Status:  Final result  Visible to patient:  No (Not Released)  Next appt:  None   Ref Range & Units 02:00  Total hemoglobin 12.0 - 16.0 g/dL 7.8    O2 Saturation % 94.2   Carboxyhemoglobin 0.5 - 1.5 % 0.5   Methemoglobin 0.0 - 1.5 % 6.6    Resulting Agency  [facility]    Specimen Collected: 09/12/16 02:00 Last Resulted: 09/12/16 02:33           Blood gas, arterial ([facility] & [facility] ONLY)  Order: [id number]   Status:  Final result  Visible to patient:  No (Not Released)  Next appt:  None  Newer results are available. Click to view them now.   Ref Range & Units 02:00  FIO2  21.00   pH, Arterial 7.350 - 7.450 7.468    pCO2 arterial 32.0 - 48.0 mmHg 31.5    pO2, Arterial 83.0 - 108.0 mmHg 96.3   Bicarbonate 20.0 - 28.0 mmol/L 22.5   Acid-base deficit 0.0 - 2.0 mmol/L 0.4   O2 Saturation % 93.7   Patient temperature  98.6   Collection site  LEFT RADIAL   Drawn by  Roswell Nickel, MD   Sample type  ARTERIAL DRAW   Allens test (pass/fail) PASS PASS          ASSESSMENT / PLAN:  20yo CF with UTI and acute hypoxemia 2/2 methemoglobinemia  (level 6.6%) from pyridium OD.  Given her acute anemia (11->8), elevated LDH, splenomegaly and indirect hyperbilirubinemia indicate mild hemolysis which may suggest an underlying G6PD deficiency.    - Supplemental O2 to keep o2 sats > 90%. Currently on HFNC 8L > titrate to keep o2 sats > 88& - Hold pyridium > need to decipher alternative meds.  - Alternative pain medication is suggested. - TTE with bubble study to r/o PH and/or shunts - CTPE negative for PE or interstitial lung disease.  - Do not draw G6PD level now - Outpatient G6PD level to be drawn - will be thorough and get HIV, alcohol level, drug screen. Check LFTs in am. Check EKG, troponin - May need IVF as pt is not eating much.  - If patient decompensates call pulmonary for IV methylene blue infusion, though at this time supportive care is most appropriate - anticipate pt will be better in next 1-2 days.   J.  Shirl Harris, MD 09/12/2016, 1:12 PM [facility] Pager [phone] After 3 pm or if no answer, call [phone]

## 2016-09-13 ENCOUNTER — Inpatient Hospital Stay (HOSPITAL_COMMUNITY): Payer: Managed Care, Other (non HMO)

## 2016-09-13 DIAGNOSIS — D589 Hereditary hemolytic anemia, unspecified: Secondary | ICD-10-CM

## 2016-09-13 DIAGNOSIS — R0600 Dyspnea, unspecified: Secondary | ICD-10-CM

## 2016-09-13 DIAGNOSIS — N3090 Cystitis, unspecified without hematuria: Secondary | ICD-10-CM

## 2016-09-13 DIAGNOSIS — J9601 Acute respiratory failure with hypoxia: Secondary | ICD-10-CM

## 2016-09-13 DIAGNOSIS — D649 Anemia, unspecified: Secondary | ICD-10-CM

## 2016-09-13 DIAGNOSIS — R0902 Hypoxemia: Secondary | ICD-10-CM

## 2016-09-13 DIAGNOSIS — R161 Splenomegaly, not elsewhere classified: Secondary | ICD-10-CM

## 2016-09-13 LAB — HIV ANTIBODY (ROUTINE TESTING W REFLEX): HIV Screen 4th Generation wRfx: NONREACTIVE

## 2016-09-13 LAB — CBC
HCT: 25 % — ABNORMAL LOW (ref 36.0–46.0)
Hemoglobin: 7.6 g/dL — ABNORMAL LOW (ref 12.0–15.0)
MCH: 30 pg (ref 26.0–34.0)
MCHC: 30.4 g/dL (ref 30.0–36.0)
MCV: 98.8 fL (ref 78.0–100.0)
Platelets: 225 10*3/uL (ref 150–400)
RBC: 2.53 MIL/uL — ABNORMAL LOW (ref 3.87–5.11)
RDW: 17.5 % — ABNORMAL HIGH (ref 11.5–15.5)
WBC: 7.8 10*3/uL (ref 4.0–10.5)

## 2016-09-13 LAB — BASIC METABOLIC PANEL
Anion gap: 4 — ABNORMAL LOW (ref 5–15)
BUN: 11 mg/dL (ref 6–20)
CO2: 25 mmol/L (ref 22–32)
Calcium: 8.9 mg/dL (ref 8.9–10.3)
Chloride: 108 mmol/L (ref 101–111)
Creatinine, Ser: 0.74 mg/dL (ref 0.44–1.00)
GFR calc Af Amer: >60 mL/min (ref >60)
GFR calc non Af Amer: >60 mL/min (ref >60)
Glucose, Bld: 144 mg/dL — ABNORMAL HIGH (ref 65–99)
Potassium: 3.6 mmol/L (ref 3.5–5.1)
Sodium: 137 mmol/L (ref 135–145)

## 2016-09-13 LAB — TROPONIN I: Troponin I: <0.03 ng/mL (ref <0.03)

## 2016-09-13 LAB — BILIRUBIN, FRACTIONATED(TOT/DIR/INDIR)
Bilirubin, Direct: 0.3 mg/dL (ref 0.1–0.5)
Indirect Bilirubin: 1.2 mg/dL — ABNORMAL HIGH (ref 0.3–0.9)
Total Bilirubin: 1.5 mg/dL — ABNORMAL HIGH (ref 0.3–1.2)

## 2016-09-13 LAB — HAPTOGLOBIN: Haptoglobin: <10 mg/dL — ABNORMAL LOW (ref 34–200)

## 2016-09-13 NOTE — Progress Notes (Signed)
PROGRESS NOTE  Mary MechanicMeredith M Orozco ZOX:[id number] DOB: October 01, 1996 DOA: 09/11/2016 PCP: No primary care provider on file.  HPI/Recap of past 424 hours: 20 year old female with essentially no past medical history of this and some recent dysuria and pelvic pain presented to the urgent care on 12/1 with complaints of dysuria that time was found to actually be hypoxic with oxygen saturations of 84% on room air. Patient denied any history of asthma or wheezing. In the emergency room, she is found have a hemoglobin of 8.8, down from a baseline of 11.3 a year ago. She is also noted to be requiring more and more oxygen and actually acquired to 50% nonrebreather. D-dimer and chest CT and chest x-ray were all unremarkable.  Patient's other labs noted an elevated LDH, splenomegaly seen on chest CT and indirect hyperbilirubinemia noting a mild hemolysis. Patient put on Pyridium for her UTI symptoms and suspect that she has underlying G6PD deficiency. In addition, there was suspicion the patient may have possible shunt or pulmonary hypertension as an underlying cause. Patient was placed in the stepdown unit on the hospitalist service.  Other past few days, patient has continued to improve. Down to 2 L nasal cannula. Hemoglobin dropped slightly overnight, down to 7.6 from 8.1 with an increase in indirect bilirubin. Patient switched over to elmiron. Still complains of some abdominal pain, worse when urinating. Otherwise she is overall feeling better. Underwent 2-D echo with bubble study, with results pending-although preliminary report looks to be normal    Assessment/Plan: Acute Resp Failure with hypoxia: Suspected G6PD deficiency given labs and history.  Also, may have underlying shunt.  Supportive measures. Can't check for G^PD until as outpatient.Awaiting final portable study, prelim report looks to be normal   Anxiety: prn ativan  UTI: IV fluids to flush out pyridium, replace with elmiron. Some benefit although  still with some bladder spasms,    Code Status: Full   Family Communication: Spoke w/mom and aunt at the bedside  Disposition Plan: Transfer to floor. Discharge once hemoglobin stable    Consultants:  Pulmonary   Procedures:  Bubble study echo ordered   Antimicrobials:  None   DVT prophylaxis:  SCD   Objective: Vitals:   09/13/16 0601 09/13/16 0708 09/13/16 0800 09/13/16 0900  BP: (!) 111/54  (!) 117/59 (!) 139/54  Pulse: 70  87 92  Resp: 14  10 16   Temp:  98.7 F (37.1 C)    TempSrc:  Oral    SpO2: 95%  97% 92%  Weight:      Height:        Intake/Output Summary (Last 24 hours) at 09/13/16 1155 Last data filed at 09/13/16 1000  Gross per 24 hour  Intake             1880 ml  Output                0 ml  Net             1880 ml   Filed Weights   09/11/16 1927 09/12/16 0300  Weight: 90.8 kg (200 lb 4 oz) 88.6 kg (195 lb 5.2 oz)    Exam:   GeneralAlert and oriented 3, no acute distress, pale  Cardiovascular: Regular rate and rhythm, S1-S2  Respiratory: Clear to auscultation bilaterally   Abdomen: soft, ND, +BS   Musculoskeletal: No clubbing or cyanosis or edema   Skin: No skin breaks or tears or lesions  Psychiatry: No evidence of psychoses  Data Reviewed: CBC:  Recent Labs Lab 09/11/16 2002 09/12/16 0338 09/13/16 0059  WBC 9.1 8.2 7.8  NEUTROABS 7.0  --   --   HGB 8.8* 8.1* 7.6*  HCT 27.7* 26.2* 25.0*  MCV 96.9 96.3 98.8  PLT 241 232 225   Basic Metabolic Panel:  Recent Labs Lab 09/11/16 2002 09/12/16 0338 09/13/16 0059  NA 138 139 137  K 3.9 3.5 3.6  CL 106 105 108  CO2 25 27 25   GLUCOSE 106* 115* 144*  BUN 12 12 11   CREATININE 0.81 0.80 0.74  CALCIUM 9.9 9.4 8.9  MG  --  2.0  --   PHOS  --  3.7  --    GFR: Estimated Creatinine Clearance: 119.9 mL/min (by C-G formula based on SCr of 0.74 mg/dL). Liver Function Tests:  Recent Labs Lab 09/11/16 2002 09/12/16 0338 09/13/16 0059  AST 29 27  --   ALT 17 16  --    ALKPHOS 62 59  --   BILITOT 1.3* 1.3* 1.5*  PROT 7.3 6.9  --   ALBUMIN 4.6 4.4  --    No results for input(s): LIPASE, AMYLASE in the last 168 hours. No results for input(s): AMMONIA in the last 168 hours. Coagulation Profile:  Recent Labs Lab 09/12/16 1300  INR 1.04   Cardiac Enzymes:  Recent Labs Lab 09/12/16 1300 09/12/16 1823 09/13/16 0059  TROPONINI <0.03 <0.03 <0.03   BNP (last 3 results) No results for input(s): PROBNP in the last 8760 hours. HbA1C: No results for input(s): HGBA1C in the last 72 hours. CBG: No results for input(s): GLUCAP in the last 168 hours. Lipid Profile: No results for input(s): CHOL, HDL, LDLCALC, TRIG, CHOLHDL, LDLDIRECT in the last 72 hours. Thyroid Function Tests:  Recent Labs  09/12/16 0338  TSH 3.773   Anemia Panel:  Recent Labs  09/12/16 0025  VITAMINB12 266  FOLATE 10.7  FERRITIN 27  TIBC 431  IRON 70  RETICCTPCT 11.6*   Urine analysis:    Component Value Date/Time   COLORURINE ORANGE (A) 09/11/2016 2054   APPEARANCEUR CLOUDY (A) 09/11/2016 2054   LABSPEC 1.010 09/11/2016 2054   PHURINE 7.5 09/11/2016 2054   GLUCOSEU NEGATIVE 09/11/2016 2054   HGBUR NEGATIVE 09/11/2016 2054   BILIRUBINUR NEGATIVE 09/11/2016 2054   KETONESUR NEGATIVE 09/11/2016 2054   PROTEINUR NEGATIVE 09/11/2016 2054   UROBILINOGEN 1.0 01/12/2015 1257   NITRITE POSITIVE (A) 09/11/2016 2054   LEUKOCYTESUR LARGE (A) 09/11/2016 2054   Sepsis Labs: @LABRCNTIP (procalcitonin:4,lacticidven:4)  ) Recent Results (from the past 240 hour(s))  MRSA PCR Screening     Status: Abnormal   Collection Time: 09/12/16  6:58 AM  Result Value Ref Range Status   MRSA by PCR POSITIVE (A) NEGATIVE Final    Comment:        The GeneXpert MRSA Assay (FDA approved for NASAL specimens only), is one component of a comprehensive MRSA colonization surveillance program. It is not intended to diagnose MRSA infection nor to guide or monitor treatment for MRSA  infections. RESULT CALLED TO, READ BACK BY AND VERIFIED WITH: ARNOLD,A AT 1220 ON [id number] BY HOOKER,B       Studies: No results found.  Scheduled Meds: . Chlorhexidine Gluconate Cloth  6 each Topical Q0600  . famotidine  20 mg Oral Daily  . mupirocin ointment  1 application Nasal BID  . pentosan polysulfate  100 mg Oral TID  . pregabalin  150 mg Oral BID  . sodium chloride flush  3  mL Intravenous Q12H    Continuous Infusions:    LOS: 1 day     Hollice Espy, MD [facility] Pager [phone]  If 7PM-7AM, please contact night-coverage [email or web address] Password [email or web address] 09/13/2016, 11:55 AM

## 2016-09-13 NOTE — Progress Notes (Signed)
  Echocardiogram 2D Echocardiogram with Bubble  has been performed.  Janalyn HarderWest, Anani Gu R 09/13/2016, 10:06 AM

## 2016-09-13 NOTE — Progress Notes (Signed)
 Name: Mary Orozco MRN: [id number] DOB: 10/20/1995    ADMISSION DATE:  09/11/2016 CONSULTATION DATE:  09/12/2016  REFERRING MD :  Dr. Doutova  CHIEF COMPLAINT:  Hypoxemia  HISTORY OF PRESENT ILLNESS:   20 CF with h/o fibromyalgia and interstitial cystitis who presented with complaints of dysuria and pelvic pain.  She was seen in the urgent care and was found to have an oxygen saturation of 85%.  She was placed on a non-re breather and sent right to the ED, in the ED she was left on the non-re breather but sats never got above 93%.  She states that 2 weeks ago she had her menstrual period (her flow is usually very heavy) with her increased pelvic pain over the last 2 weeks she has been taking her pyridium much more than usual (upto 5 times per day).  She denies and SOB, DOE, chest pain, n/v/d.  She has not had any change in her exercise tolerance.  She is a never smoker. She has never had lung issues. No family h/o lung issues.    SUBJECTIVE:  Feels better.  On 2L O2. Work up (-) so far.    VITAL SIGNS: Temp:  [98.2 F (36.8 C)-99 F (37.2 C)] 98.7 F (37.1 C) (12/03 0708) Pulse Rate:  [69-92] 84 (12/03 1200) Resp:  [10-19] 19 (12/03 1200) BP: (111-139)/(47-62) 128/54 (12/03 1200) SpO2:  [92 %-99 %] 95 % (12/03 1200)  PHYSICAL EXAMINATION: General:  NAD, obese. pale Neuro:  CN II-XII intact HEENT:  Pale conjunctiva. More "color" today.  Cardiovascular:  Good s1/s2. , no m/r/g Lungs:  CTA b/l no w/r/r Abdomen:  Soft, non tender, normal bowel sounds Musculoskeletal:  Normal bulk and tone Skin:  Pale   Recent Labs Lab 09/11/16 2002 09/12/16 0338 09/13/16 0059  NA 138 139 137  K 3.9 3.5 3.6  CL 106 105 108  CO2 25 27 25  BUN 12 12 11  CREATININE 0.81 0.80 0.74  GLUCOSE 106* 115* 144*    Recent Labs Lab 09/11/16 2002 09/12/16 0338 09/13/16 0059  HGB 8.8* 8.1* 7.6*  HCT 27.7* 26.2* 25.0*  WBC 9.1 8.2 7.8  PLT 241 232 225   Dg Chest 2  View  Result Date: 09/11/2016 CLINICAL DATA:  Hypoxia EXAM: CHEST  2 VIEW COMPARISON:  None. FINDINGS: The heart size and mediastinal contours are within normal limits. Both lungs are clear. The visualized skeletal structures are unremarkable. IMPRESSION: No active cardiopulmonary disease. Electronically Signed   By: Kim  Fujinaga M.D.   On: 09/11/2016 21:14   Ct Angio Chest Pe W/cm &/or Wo Cm  Result Date: 09/11/2016 CLINICAL DATA:  20 y/o F; history of anemia and irritable bowel syndrome with low oxygen saturation. EXAM: CT ANGIOGRAPHY CHEST WITH CONTRAST TECHNIQUE: Multidetector CT imaging of the chest was performed using the standard protocol during bolus administration of intravenous contrast. Multiplanar CT image reconstructions and MIPs were obtained to evaluate the vascular anatomy. CONTRAST:  100 cc Isovue 370 COMPARISON:  09/11/2016 chest radiograph FINDINGS: Cardiovascular: Satisfactory opacification of the pulmonary arteries to the segmental level. No evidence of pulmonary embolism. Normal heart size. No pericardial effusion. Mediastinum/Nodes: No enlarged mediastinal, hilar, or axillary lymph nodes. Thyroid gland, trachea, and esophagus demonstrate no significant findings. Lungs/Pleura: Lungs are clear. No pleural effusion or pneumothorax. Upper Abdomen: Partially visualize splenomegaly. Musculoskeletal: No chest wall abnormality. No acute or significant osseous findings. Review of the MIP images confirms the above findings. IMPRESSION: 1. No evidence of pulmonary   embolus. 2. Clear lungs. 3. Partially visualize splenomegaly. Electronically Signed   By: Kristine Garbe M.D.   On: 09/11/2016 22:59   US Abdomen Complete  Result Date: 09/12/2016 CLINICAL DATA:  Splenomegaly.  Low oxygen saturations. EXAM: ABDOMEN ULTRASOUND COMPLETE COMPARISON:  Chest CT 09/11/2016 FINDINGS: Gallbladder: No gallstones or wall thickening visualized. No sonographic Murphy sign noted by sonographer. Common  bile duct: Diameter: 0.5 cm. Liver: No focal lesion identified. Within normal limits in parenchymal echogenicity. IVC: No abnormality visualized. Pancreas: Visualized portion unremarkable. Pancreatic tail is obscured by gas. Spleen: Measures 13.2 x 5.7 x 13.4 cm. Calculated splenic volume is 504 mL. No focal splenic lesion. Right Kidney: Length: 13.2 cm. Echogenicity within normal limits. No mass or hydronephrosis visualized. Left Kidney: Length: 13.7 cm. Echogenicity within normal limits. No mass or hydronephrosis visualized. Abdominal aorta: No aneurysm visualized. Other findings: None. IMPRESSION: Mild splenomegaly. Calculated splenic volume is 504 mL. Otherwise, normal abdominal ultrasound. Electronically Signed   By: Markus Daft M.D.   On: 09/12/2016 09:56   .Cooxemetry Panel (carboxy, met, total hgb, O2 sat)  Order: [id number]  Status:  Final result  Visible to patient:  No (Not Released)  Next appt:  None   Ref Range & Units 02:00  Total hemoglobin 12.0 - 16.0 g/dL 7.8    O2 Saturation % 94.2   Carboxyhemoglobin 0.5 - 1.5 % 0.5   Methemoglobin 0.0 - 1.5 % 6.6    Resulting Agency  [facility]    Specimen Collected: 09/12/16 02:00 Last Resulted: 09/12/16 02:33           Blood gas, arterial ([facility] ONLY)  Order: [id number]   Status:  Final result  Visible to patient:  No (Not Released)  Next appt:  None  Newer results are available. Click to view them now.   Ref Range & Units 02:00  FIO2  21.00   pH, Arterial 7.350 - 7.450 7.468    pCO2 arterial 32.0 - 48.0 mmHg 31.5    pO2, Arterial 83.0 - 108.0 mmHg 96.3   Bicarbonate 20.0 - 28.0 mmol/L 22.5   Acid-base deficit 0.0 - 2.0 mmol/L 0.4   O2 Saturation % 93.7   Patient temperature  98.6   Collection site  LEFT RADIAL   Drawn by  Roswell Nickel, MD   Sample type  ARTERIAL DRAW   Allens test (pass/fail) PASS PASS          ASSESSMENT / PLAN:  20yo CF with UTI and acute hypoxemia 2/2 methemoglobinemia (level 6.6%) from  pyridium OD.  Given her acute anemia (11->8), elevated LDH, splenomegaly and indirect hyperbilirubinemia indicate mild hemolysis which may suggest an underlying G6PD deficiency.    - Supplemental O2 to keep o2 sats > 90%. Currently on 2L [place] > anticipate, we can wean off next 24 hrs.  - Echo was N and (-) for shunt. EKG was unremarkable.  - CTPE negative for PE or interstitial lung disease.  - Outpatient G6PD level to be drawn - anticipate d/c in 1-2 days - needs work up (outpt) for possible G6PD deficiency.  - PCCM will sign off for now. Call back if with questions.    Monica Becton, MD 09/13/2016, 1:08 PM [facility] Pager [phone] After 3 pm or if no answer, call [phone]

## 2016-09-13 NOTE — Progress Notes (Signed)
Patient ambulated on hallway at this time, tolerated well. O2 sat was between 95-98% on RA with HR in the low 120s.

## 2016-09-14 DIAGNOSIS — D592 Drug-induced nonautoimmune hemolytic anemia: Secondary | ICD-10-CM

## 2016-09-14 LAB — COMPREHENSIVE METABOLIC PANEL
ALT: 12 U/L — ABNORMAL LOW (ref 14–54)
AST: 23 U/L (ref 15–41)
Albumin: 3.8 g/dL (ref 3.5–5.0)
Alkaline Phosphatase: 55 U/L (ref 38–126)
Anion gap: 7 (ref 5–15)
BUN: 12 mg/dL (ref 6–20)
CO2: 25 mmol/L (ref 22–32)
Calcium: 8.7 mg/dL — ABNORMAL LOW (ref 8.9–10.3)
Chloride: 104 mmol/L (ref 101–111)
Creatinine, Ser: 0.73 mg/dL (ref 0.44–1.00)
GFR calc Af Amer: >60 mL/min (ref >60)
GFR calc non Af Amer: >60 mL/min (ref >60)
Glucose, Bld: 115 mg/dL — ABNORMAL HIGH (ref 65–99)
Potassium: 3.8 mmol/L (ref 3.5–5.1)
Sodium: 136 mmol/L (ref 135–145)
Total Bilirubin: 1.1 mg/dL (ref 0.3–1.2)
Total Protein: 6.7 g/dL (ref 6.5–8.1)

## 2016-09-14 LAB — CBC
HCT: 24.8 % — ABNORMAL LOW (ref 36.0–46.0)
Hemoglobin: 7.6 g/dL — ABNORMAL LOW (ref 12.0–15.0)
MCH: 30.2 pg (ref 26.0–34.0)
MCHC: 30.6 g/dL (ref 30.0–36.0)
MCV: 98.4 fL (ref 78.0–100.0)
Platelets: 225 10*3/uL (ref 150–400)
RBC: 2.52 MIL/uL — ABNORMAL LOW (ref 3.87–5.11)
RDW: 17.7 % — ABNORMAL HIGH (ref 11.5–15.5)
WBC: 9.2 10*3/uL (ref 4.0–10.5)

## 2016-09-14 LAB — URINE CULTURE

## 2016-09-14 LAB — PATHOLOGIST SMEAR REVIEW

## 2016-09-14 LAB — LACTATE DEHYDROGENASE: LDH: 354 U/L — ABNORMAL HIGH (ref 98–192)

## 2016-09-14 MED ORDER — HYDROCODONE-ACETAMINOPHEN 5-325 MG PO TABS: 1.0000 | ORAL_TABLET | ORAL | 0 refills | Status: DC | PRN

## 2016-09-14 MED ORDER — PENTOSAN POLYSULFATE SODIUM 100 MG PO CAPS: 100.0000 mg | ORAL_CAPSULE | Freq: Three times a day (TID) | ORAL | 2 refills | Status: DC

## 2016-09-14 NOTE — Discharge Instructions (Signed)
Glucose-6-Phosphate Dehydrogenase Test Glucose-6-phosphate dehydrogenase (G6PD) is a type of protein (enzyme) that helps your body use sugar (glucose) for energy. This is known as glucose metabolism. The G6PD test is a blood test to determine how your body is metabolizing glucose. Some people are born with not enough (a deficiency) of this enzyme, making them unable to properly metabolize glucose. G6PD deficiency is a condition that is passed from parents to children (genetic inheritance). What do the results mean? It is your responsibility to obtain your test results. Ask the lab or department performing the test when and how you will get your results. Contact your health care provider to discuss any questions you have about your results. Your results will be a range of values. Range of Normal Values  Ranges for normal values may vary among different labs and hospitals. You should always check with your health care provider after having lab work or other tests done to discuss whether your values are considered within normal limits. Normal findings include:  Negative (quantification).  10.1-14.1 International Units per gram of hemoglobin.  146-376 units per trillion red blood cells.  G6PD sequencing: no mutation noted. Meaning of Results Outside Normal Value Ranges  G6PD test results that are above normal values may indicate a number of health conditions. These may include:  Pernicious anemia.  Ongoing (chronic) blood loss. G6PD test results that are below normal values may indicate G6PD deficiency. Discuss the results of your test with your health care provider. Your health care provider will use the results of this test to make a diagnosis and determine a treatment plan that is right for you. Talk with your health care provider to discuss your results, treatment options, and if necessary, the need for more tests. Talk with your health care provider if you have any questions about your  results. This information is not intended to replace advice given to you by your health care provider. Make sure you discuss any questions you have with your health care provider. Document Released: 10/21/2004 Document Revised: 06/02/2016 Document Reviewed: 02/06/2014 Elsevier Interactive Patient Education  2017 [employer]

## 2016-09-14 NOTE — Discharge Summary (Signed)
Discharge Summary  Mary Orozco ZOX:[id number] DOB: 03/26/1996  PCP: P[facility] In [facility] in [place], [place]  Phone: [phone] Fax: [phone]  Admit date: 09/11/2016 Discharge date: 09/14/2016  Time spent: 25 minutes   Recommendations for Outpatient Follow-up:  1. Medication change: Patient advised to not take Pyridium 2. New medication: Elmiron 100 mg by mouth 3 times a day 3. Patient will follow up with her PCP next week and at that time be evaluated for G6PD deficiency testing 4. Patient will discuss with her PCP about whether she would be a good candidate for amitriptyline  Discharge Diagnoses:  Active Hospital Problems   Diagnosis Date Noted  . Acute respiratory failure with hypoxia (HCC) 09/12/2016  . Hypoxia   . Anemia 09/12/2016  . Splenomegaly 09/12/2016  . Cystitis 09/12/2016    Resolved Hospital Problems   Diagnosis Date Noted Date Resolved  No resolved problems to display.    Discharge Condition: Improved, being discharged home   Diet recommendation: Regular   Vitals:   09/13/16 2122 09/14/16 0448  BP: 122/68 109/60  Pulse: 90 88  Resp: 16 16  Temp: 97.5 F (36.4 C) 98.6 F (37 C)    History of present illness:  20 year old female with essentially no past medical history of this and some recent dysuria and pelvic pain presented to the urgent care on 12/1 with complaints of dysuria that time was found to actually be hypoxic with oxygen saturations of 84% on room air. Patient denied any history of asthma or wheezing. In the emergency room, she is found have a hemoglobin of 8.8, down from a baseline of 11.3 a year ago. She is also noted to be requiring more and more oxygen and actually acquired to 50% nonrebreather. D-dimer and chest CT and chest x-ray were all unremarkable.  Patient's other labs noted an elevated LDH, splenomegaly seen on chest CT and indirect hyperbilirubinemia noting a mild hemolysis. Patient put on Pyridium for her UTI  symptoms and suspect that she has underlying G6PD deficiency. In addition, there was suspicion the patient may have possible shunt or pulmonary hypertension as an underlying cause. Patient was placed in the stepdown unit on the hospitalist service.   Hospital Course:  Principal Problem:   Acute respiratory failure with hypoxia (HCC) secondary to hemolytic anemia crisis, possibly from G6PD deficiency: Over the next few days, patient continued to improve. Hemoglobin dropped slightly down lower on hospital day 2: 7.6, but has since remained stable since. Bilirubin also dropped down to normal by 12/4. Patient felt to be out of acute hemolytic crisis. Oxygenation went down from 50% Ventimask to 6 L nasal cannula by hospital day 2 and by 12/4, patient was on room air and able to ambulate maintaining good oxygen saturations.  Again, suspected G6PD deficiency given labs and history. Will not be able to check G6PD testing while patient recovering from acute hemolysis. This will need to be done as an outpatient. In the meantime, recommended patient be off of Pyridium. It is questionable whether she has this diagnosis given that she is actually taken Pyridium intermittently for the past year. The only new medication she's been on the last month is naltrexone which in review of the literature would not cause hemolysis. Active Problems:    Splenomegaly: Mild, confirmed by abdominal ultrasound. Intermittent follow-up surveillance.    Interstitial cystitis: This is been a long recurring problem for the patient. She seen a number of urologists for this. Pyridium has helped in the  past, however with current issues as above, this medication is no longer recommended for her. Patient started on Elmiron 100 3 times a day which has given her some benefit, but she still has significant pelvic pain, especially with urinating. In review of the literature, patient might be a good candidate for amitriptyline. This has been found to  be effective in both interstitial cystitis and fibromyalgia which the patient also suffers from. Defer this to her PCP  Fibromyalgia: See above. IBS: On naltrexone, patient has had some benefit from this    Procedures:  2-D echo with bubble study: Normal echocardiogram. Preserved ejection fraction. No valvular dysfunction. No evidence of shunting or PFO   Consultations:  Pulmonary medicine   Discharge Exam: BP 109/60 (BP Location: Right Arm)   Pulse 88   Temp 98.6 F (37 C) (Oral)   Resp 16   Ht 5\' 4"  (1.626 m)   Wt 88.6 kg (195 lb 5.2 oz)   LMP 08/25/2016   SpO2 96%   BMI 33.53 kg/m   General: Alert and oriented 3, no acute distress  Cardiovascular: Regular rate and rhythm, S1-S2  Respiratory: Clear to auscultation bilaterally   Discharge Instructions You were cared for by a hospitalist during your hospital stay. If you have any questions about your discharge medications or the care you received while you were in the hospital after you are discharged, you can call the unit and asked to speak with the hospitalist on call if the hospitalist that took care of you is not available. Once you are discharged, your primary care physician will handle any further medical issues. Please note that NO REFILLS for any discharge medications will be authorized once you are discharged, as it is imperative that you return to your primary care physician (or establish a relationship with a primary care physician if you do not have one) for your aftercare needs so that they can reassess your need for medications and monitor your lab values.  Discharge Instructions    Diet - low sodium heart healthy    Complete by:  As directed    Increase activity slowly    Complete by:  As directed        Medication List    STOP taking these medications   cephALEXin 500 MG capsule Commonly known as:  KEFLEX   phenazopyridine 95 MG tablet Commonly known as:  PYRIDIUM     TAKE these medications     ALPRAZolam 0.5 MG tablet Commonly known as:  XANAX Take 0.5 mg by mouth 2 (two) times daily as needed for anxiety.   HYDROcodone-acetaminophen 5-325 MG tablet Commonly known as:  NORCO/VICODIN Take 1 tablet by mouth every 4 (four) hours as needed for moderate pain.   medroxyPROGESTERone 150 MG/ML injection Commonly known as:  DEPO-PROVERA   mupirocin ointment 2 % Commonly known as:  BACTROBAN Apply 1 application topically daily.   NALTREXONE HCL PO Take 1.5 mg by mouth at bedtime.   ondansetron 4 MG tablet Commonly known as:  ZOFRAN Take 1 tablet (4 mg total) by mouth every 6 (six) hours.   pentosan polysulfate 100 MG capsule Commonly known as:  ELMIRON Take 1 capsule (100 mg total) by mouth 3 (three) times daily.   pregabalin 150 MG capsule Commonly known as:  LYRICA Take 150 mg by mouth 2 (two) times daily.   ranitidine 150 MG tablet Commonly known as:  ZANTAC Take 150 mg by mouth 2 (two) times daily.  Allergies  Allergen Reactions  . Diflucan [Fluconazole] Rash  . Sulfa Antibiotics Rash   Follow-up Information    Sherryll Burgervy Todd. Schedule an appointment as soon as possible for a visit in 1 week(s).   Why:  Discuss about amitriptyline & get labs for G6PD           The results of significant diagnostics from this hospitalization (including imaging, microbiology, ancillary and laboratory) are listed below for reference.    Significant Diagnostic Studies: Dg Chest 2 View  Result Date: 09/11/2016 CLINICAL DATA:  Hypoxia EXAM: CHEST  2 VIEW COMPARISON:  None. FINDINGS: The heart size and mediastinal contours are within normal limits. Both lungs are clear. The visualized skeletal structures are unremarkable. IMPRESSION: No active cardiopulmonary disease. Electronically Signed   By: Jasmine PangKim  Fujinaga M.D.   On: 09/11/2016 21:14   Ct Angio Chest Pe W/cm &/or Wo Cm  Result Date: 09/11/2016 CLINICAL DATA:  20 y/o F; history of anemia and irritable bowel syndrome with  low oxygen saturation. EXAM: CT ANGIOGRAPHY CHEST WITH CONTRAST TECHNIQUE: Multidetector CT imaging of the chest was performed using the standard protocol during bolus administration of intravenous contrast. Multiplanar CT image reconstructions and MIPs were obtained to evaluate the vascular anatomy. CONTRAST:  100 cc Isovue 370 COMPARISON:  09/11/2016 chest radiograph FINDINGS: Cardiovascular: Satisfactory opacification of the pulmonary arteries to the segmental level. No evidence of pulmonary embolism. Normal heart size. No pericardial effusion. Mediastinum/Nodes: No enlarged mediastinal, hilar, or axillary lymph nodes. Thyroid gland, trachea, and esophagus demonstrate no significant findings. Lungs/Pleura: Lungs are clear. No pleural effusion or pneumothorax. Upper Abdomen: Partially visualize splenomegaly. Musculoskeletal: No chest wall abnormality. No acute or significant osseous findings. Review of the MIP images confirms the above findings. IMPRESSION: 1. No evidence of pulmonary embolus. 2. Clear lungs. 3. Partially visualize splenomegaly. Electronically Signed   By: Mitzi HansenLance  Furusawa-Stratton M.D.   On: 09/11/2016 22:59   [place] Abdomen Complete  Result Date: 09/12/2016 CLINICAL DATA:  Splenomegaly.  Low oxygen saturations. EXAM: ABDOMEN ULTRASOUND COMPLETE COMPARISON:  Chest CT 09/11/2016 FINDINGS: Gallbladder: No gallstones or wall thickening visualized. No sonographic Murphy sign noted by sonographer. Common bile duct: Diameter: 0.5 cm. Liver: No focal lesion identified. Within normal limits in parenchymal echogenicity. IVC: No abnormality visualized. Pancreas: Visualized portion unremarkable. Pancreatic tail is obscured by gas. Spleen: Measures 13.2 x 5.7 x 13.4 cm. Calculated splenic volume is 504 mL. No focal splenic lesion. Right Kidney: Length: 13.2 cm. Echogenicity within normal limits. No mass or hydronephrosis visualized. Left Kidney: Length: 13.7 cm. Echogenicity within normal limits. No mass or  hydronephrosis visualized. Abdominal aorta: No aneurysm visualized. Other findings: None. IMPRESSION: Mild splenomegaly. Calculated splenic volume is 504 mL. Otherwise, normal abdominal ultrasound. Electronically Signed   By: Richarda OverlieAdam  Henn M.D.   On: 09/12/2016 09:56    Microbiology: Recent Results (from the past 240 hour(s))  Urine culture     Status: Abnormal   Collection Time: 09/12/16 12:52 AM  Result Value Ref Range Status   Specimen Description URINE, CLEAN CATCH  Final   Special Requests NONE  Final   Culture MULTIPLE SPECIES PRESENT, SUGGEST RECOLLECTION (A)  Final   Report Status 09/14/2016 FINAL  Final  MRSA PCR Screening     Status: Abnormal   Collection Time: 09/12/16  6:58 AM  Result Value Ref Range Status   MRSA by PCR POSITIVE (A) NEGATIVE Final    Comment:        The GeneXpert MRSA Assay (FDA approved for  NASAL specimens only), is one component of a comprehensive MRSA colonization surveillance program. It is not intended to diagnose MRSA infection nor to guide or monitor treatment for MRSA infections. RESULT CALLED TO, READ BACK BY AND VERIFIED WITH: ARNOLD,A AT 1220 ON [id number] BY HOOKER,B      Labs: Basic Metabolic Panel:  Recent Labs Lab 09/11/16 2002 09/12/16 0338 09/13/16 0059 09/14/16 0504  NA 138 139 137 136  K 3.9 3.5 3.6 3.8  CL 106 105 108 104  CO2 25 27 25 25   GLUCOSE 106* 115* 144* 115*  BUN 12 12 11 12   CREATININE 0.81 0.80 0.74 0.73  CALCIUM 9.9 9.4 8.9 8.7*  MG  --  2.0  --   --   PHOS  --  3.7  --   --    Liver Function Tests:  Recent Labs Lab 09/11/16 2002 09/12/16 0338 09/13/16 0059 09/14/16 0504  AST 29 27  --  23  ALT 17 16  --  12*  ALKPHOS 62 59  --  55  BILITOT 1.3* 1.3* 1.5* 1.1  PROT 7.3 6.9  --  6.7  ALBUMIN 4.6 4.4  --  3.8   No results for input(s): LIPASE, AMYLASE in the last 168 hours. No results for input(s): AMMONIA in the last 168 hours. CBC:  Recent Labs Lab 09/11/16 2002 09/12/16 0338 09/13/16 0059  09/14/16 0504  WBC 9.1 8.2 7.8 9.2  NEUTROABS 7.0  --   --   --   HGB 8.8* 8.1* 7.6* 7.6*  HCT 27.7* 26.2* 25.0* 24.8*  MCV 96.9 96.3 98.8 98.4  PLT 241 232 225 225   Cardiac Enzymes:  Recent Labs Lab 09/12/16 1300 09/12/16 1823 09/13/16 0059  TROPONINI <0.03 <0.03 <0.03   BNP: BNP (last 3 results) No results for input(s): BNP in the last 8760 hours.  ProBNP (last 3 results) No results for input(s): PROBNP in the last 8760 hours.  CBG: No results for input(s): GLUCAP in the last 168 hours.     Signed:  Hollice Espy, MD [facility] 09/14/2016, 12:19 PM

## 2016-09-14 NOTE — Progress Notes (Signed)
Patient discharged home, discharge instructions given and explained to patient/mother and they verbalized understanding, denies any pain/distress., skin intact, no wound noted. Accompanied home by mother, transported to the car by staff via wheelchair.

## 2016-09-30 LAB — MISC LABCORP TEST (SEND OUT)

## 2017-03-27 ENCOUNTER — Emergency Department (HOSPITAL_COMMUNITY)
Admission: EM | Admit: 2017-03-27 | Discharge: 2017-03-28 | Disposition: A | Discharge status: Home / Self Care | Payer: 59 | Attending: Emergency Medicine | Admitting: Emergency Medicine

## 2017-03-27 ENCOUNTER — Encounter (HOSPITAL_COMMUNITY): Payer: Self-pay | Admitting: Emergency Medicine

## 2017-03-27 ENCOUNTER — Ambulatory Visit (HOSPITAL_COMMUNITY)
Admission: EM | Admit: 2017-03-27 | Discharge: 2017-03-27 | Disposition: A | Discharge status: Home / Self Care | Payer: 59 | Source: Home / Self Care | Attending: Family Medicine | Admitting: Family Medicine

## 2017-03-27 ENCOUNTER — Emergency Department (HOSPITAL_COMMUNITY): Payer: 59

## 2017-03-27 DIAGNOSIS — G43909 Migraine, unspecified, not intractable, without status migrainosus: Secondary | ICD-10-CM

## 2017-03-27 DIAGNOSIS — R109 Unspecified abdominal pain: Secondary | ICD-10-CM

## 2017-03-27 DIAGNOSIS — Z87442 Personal history of urinary calculi: Secondary | ICD-10-CM

## 2017-03-27 DIAGNOSIS — K589 Irritable bowel syndrome without diarrhea: Secondary | ICD-10-CM | POA: Insufficient documentation

## 2017-03-27 DIAGNOSIS — Z3202 Encounter for pregnancy test, result negative: Secondary | ICD-10-CM | POA: Diagnosis not present

## 2017-03-27 LAB — POCT URINALYSIS DIP (DEVICE)
Bilirubin Urine: NEGATIVE
Glucose, UA: NEGATIVE mg/dL
Ketones, ur: NEGATIVE mg/dL
Leukocytes, UA: NEGATIVE
Nitrite: NEGATIVE
Protein, ur: NEGATIVE mg/dL
Specific Gravity, Urine: 1.02 (ref 1.005–1.030)
Urobilinogen, UA: 0.2 mg/dL (ref 0.0–1.0)
pH: 6 (ref 5.0–8.0)

## 2017-03-27 LAB — BASIC METABOLIC PANEL
Anion gap: 8 (ref 5–15)
BUN: 7 mg/dL (ref 6–20)
CO2: 25 mmol/L (ref 22–32)
Calcium: 9.1 mg/dL (ref 8.9–10.3)
Chloride: 102 mmol/L (ref 101–111)
Creatinine, Ser: 0.72 mg/dL (ref 0.44–1.00)
GFR calc Af Amer: >60 mL/min (ref >60)
GFR calc non Af Amer: >60 mL/min (ref >60)
Glucose, Bld: 96 mg/dL (ref 65–99)
Potassium: 4.1 mmol/L (ref 3.5–5.1)
Sodium: 135 mmol/L (ref 135–145)

## 2017-03-27 LAB — CBC
HCT: 36.4 % (ref 36.0–46.0)
Hemoglobin: 11.5 g/dL — ABNORMAL LOW (ref 12.0–15.0)
MCH: 24.4 pg — ABNORMAL LOW (ref 26.0–34.0)
MCHC: 31.6 g/dL (ref 30.0–36.0)
MCV: 77.3 fL — ABNORMAL LOW (ref 78.0–100.0)
Platelets: 362 10*3/uL (ref 150–400)
RBC: 4.71 MIL/uL (ref 3.87–5.11)
RDW: 14 % (ref 11.5–15.5)
WBC: 9.2 10*3/uL (ref 4.0–10.5)

## 2017-03-27 LAB — I-STAT BETA HCG BLOOD, ED (MC, WL, AP ONLY): I-stat hCG, quantitative: <5 m[IU]/mL (ref <5)

## 2017-03-27 LAB — POCT PREGNANCY, URINE: Preg Test, Ur: NEGATIVE

## 2017-03-27 NOTE — ED Notes (Signed)
Patient transported to CT 

## 2017-03-27 NOTE — ED Triage Notes (Signed)
Pt presents to ED for assessment of left flank pain, hx of kidney stones, states pain has been constant but worsening x 3 weeks.  No hx of not being able to pass stones.  Pt c/o intermittent fever and discomfort at home.

## 2017-03-27 NOTE — ED Provider Notes (Signed)
CSN: [id number]     Arrival date & time 03/27/17  1836 History   First MD Initiated Contact with Patient 03/27/17 1950     Chief Complaint  Patient presents with  . Back Pain   (Consider location/radiation/quality/duration/timing/severity/associated sxs/prior Treatment) Patient is a 21 y.o. Female, here for left flank pain x 3 weeks. She has several kidney stones in the past and reports that this flank pain feels like another kidney stone to her. Patient normally could feel her stone moving but not this particular one. She noticed a new onset of hematuria today, which prompted her to be seem. Her flank pain is gradually getting worse within the last couple weeks. Patient states all her kidney stones in the past have been fairly uncomplicated and were all able to pass but this particular one feels like a big one to herShe also has some nausea. She reports to also have dysuria but dysuria is chronic for her due to interstitial cystitis.         Past Medical History:  Diagnosis Date  . Cystitis   . IBS (irritable bowel syndrome)   . Migraine   . Renal disorder    kidney stones   Past Surgical History:  Procedure Laterality Date  . WISDOM TOOTH EXTRACTION     Family History  Problem Relation Age of Onset  . Diabetes Other   . CAD Other    Social History  Substance Use Topics  . Smoking status: Never Smoker  . Smokeless tobacco: Never Used  . Alcohol use No   OB History    No data available     Review of Systems  Constitutional:       See HPI    Allergies  Diflucan [fluconazole] and Sulfa antibiotics  Home Medications   Prior to Admission medications   Medication Sig Start Date End Date Taking? Authorizing Provider  ALPRAZolam Prudy Feeler) 0.5 MG tablet Take 0.5 mg by mouth 2 (two) times daily as needed for anxiety.   Yes [provider]  amitriptyline (ELAVIL) 10 MG tablet Take 10 mg by mouth at bedtime.   Yes [provider]  esomeprazole (NEXIUM) 40  MG capsule Take 40 mg by mouth daily at 12 noon.   Yes [provider]  HYDROcodone-acetaminophen (NORCO/VICODIN) 5-325 MG tablet Take 1 tablet by mouth every 4 (four) hours as needed for moderate pain. 09/14/16  Yes Hollice Espy, MD  loratadine (CLARITIN) 10 MG tablet Take 10 mg by mouth daily.   Yes [provider]  nitrofurantoin (MACRODANTIN) 100 MG capsule Take 100 mg by mouth 4 (four) times daily.   Yes [provider]  ondansetron (ZOFRAN) 4 MG tablet Take 1 tablet (4 mg total) by mouth every 6 (six) hours. 01/12/15  Yes Raeford Razor, MD  pentosan polysulfate (ELMIRON) 100 MG capsule Take 1 capsule (100 mg total) by mouth 3 (three) times daily. 09/14/16  Yes Hollice Espy, MD  pregabalin (LYRICA) 150 MG capsule Take 150 mg by mouth 2 (two) times daily.   Yes [provider]  medroxyPROGESTERone (DEPO-PROVERA) 150 MG/ML injection  12/14/14   [provider]  mupirocin ointment (BACTROBAN) 2 % Apply 1 application topically daily.  01/05/15   [provider]  NALTREXONE HCL PO Take 1.5 mg by mouth at bedtime.     [provider]  ranitidine (ZANTAC) 150 MG tablet Take 150 mg by mouth 2 (two) times daily.    [provider]   Meds Ordered  and Administered this Visit  Medications - No data to display  BP 113/62 (BP Location: Right Arm)   Pulse 96   Temp 98 F (36.7 C) (Oral)   Resp 16   LMP 03/24/2017   SpO2 99%  No data found.   Physical Exam  Constitutional: She is oriented to person, place, and time. She appears well-developed and well-nourished.  HENT:  Head: Normocephalic and atraumatic.  Neck: Normal range of motion.  Cardiovascular: Normal rate, regular rhythm and normal heart sounds.   Pulmonary/Chest: Effort normal and breath sounds normal. She has no wheezes.  Abdominal: Soft. Bowel sounds are normal. There is no tenderness.  Genitourinary:  Genitourinary Comments: Positive bilateral CVA  tenderness  Neurological: She is alert and oriented to person, place, and time.  Skin: Skin is warm and dry.  Nursing note and vitals reviewed.   Urgent Care Course     Procedures (including critical care time)  Labs Review Labs Reviewed  POCT URINALYSIS DIP (DEVICE) - Abnormal; Notable for the following:       Result Value   Hgb urine dipstick SMALL (*)    All other components within normal limits  URINE CULTURE  POCT PREGNANCY, URINE    Imaging Review No results found.  MDM   1. Left flank pain    21 y.o. Female with hx of kidney stone, presents today for left flank pain for 3 weeks and she thinks her pain is due to another kidney stone. Patient states all her kidney stones int he past have been fairly uncomplicated and were all able to pass but this particular one feels like a big one to her. Given the duration of her left flank pain that is now worse with hematuria. Will send patient to ER for further evaluation.     Lucia EstelleZheng, Tamikka Pilger, NP 03/27/17 2142

## 2017-03-27 NOTE — ED Triage Notes (Signed)
Pt c/o back pain onset 3 weeks associated w/hematuria that began today, nauseas  Hx of kidney stones, interstitial cystitis   Denies fevers, vomiting, diarrhea  Taking nitrofurantoin for interstitial cystitis.   A&O x4... NAD... Ambulatory

## 2017-03-27 NOTE — ED Notes (Signed)
Called CT to ask time estimate and informed pt

## 2017-03-27 NOTE — ED Notes (Signed)
[facility] and [facility] for culture

## 2017-03-27 NOTE — ED Provider Notes (Signed)
[facility]-EMERGENCY DEPT Provider Note   CSN: [id number] Arrival date & time: 03/27/17  2025     History   Chief Complaint Chief Complaint  Patient presents with  . Abdominal Pain    HPI Mary Orozco is a 21 y.o. female.  The history is provided by the patient. No language interpreter was used.  Flank Pain  This is a new problem. Episode onset: 3 weeks ago. The problem occurs daily. The problem has been gradually worsening. Pertinent negatives include no chest pain and no shortness of breath. Exacerbated by: movement and stretching/extending back. The symptoms are relieved by narcotics (Vicodin). Treatments tried: PO pain medication. The treatment provided moderate relief.    Past Medical History:  Diagnosis Date  . Cystitis   . IBS (irritable bowel syndrome)   . Migraine   . Renal disorder    kidney stones    Patient Active Problem List   Diagnosis Date Noted  . Hypoxia   . Acute respiratory failure with hypoxia (HCC) 09/12/2016  . Anemia 09/12/2016  . Splenomegaly 09/12/2016  . Cystitis 09/12/2016    Past Surgical History:  Procedure Laterality Date  . WISDOM TOOTH EXTRACTION      OB History    No data available       Home Medications    Prior to Admission medications   Medication Sig Start Date End Date Taking? Authorizing Provider  ALPRAZolam Prudy Feeler(XANAX) 0.5 MG tablet Take 0.5 mg by mouth 2 (two) times daily as needed for anxiety.   Yes [provider]  amitriptyline (ELAVIL) 10 MG tablet Take 10 mg by mouth at bedtime.   Yes [provider]  esomeprazole (NEXIUM) 40 MG capsule Take 40 mg by mouth daily at 12 noon.   Yes [provider]  loratadine (CLARITIN) 10 MG tablet Take 10 mg by mouth daily.   Yes [provider]  nitrofurantoin (MACRODANTIN) 100 MG capsule Take 100 mg by mouth daily.    Yes [provider]  ondansetron (ZOFRAN) 4 MG tablet Take 1 tablet (4 mg total) by mouth every 6 (six) hours. 01/12/15   Yes Raeford RazorKohut, Stephen, MD  pentosan polysulfate (ELMIRON) 100 MG capsule Take 1 capsule (100 mg total) by mouth 3 (three) times daily. 09/14/16  Yes Hollice EspyKrishnan, Sendil K, MD  pregabalin (LYRICA) 150 MG capsule Take 150 mg by mouth 2 (two) times daily.   Yes [provider]  ranitidine (ZANTAC) 150 MG tablet Take 150 mg by mouth 2 (two) times daily as needed for heartburn.    Yes [provider]  ibuprofen (ADVIL,MOTRIN) 600 MG tablet Take 1 tablet (600 mg total) by mouth every 6 (six) hours as needed. 03/28/17   Antony MaduraHumes, Semaje Kinker, PA-C  tamsulosin (FLOMAX) 0.4 MG CAPS capsule Take 1 capsule (0.4 mg total) by mouth daily. 03/28/17   Antony MaduraHumes, Maeson Purohit, PA-C    Family History Family History  Problem Relation Age of Onset  . Diabetes Other   . CAD Other     Social History Social History  Substance Use Topics  . Smoking status: Never Smoker  . Smokeless tobacco: Never Used  . Alcohol use No     Allergies   Diflucan [fluconazole] and Sulfa antibiotics   Review of Systems Review of Systems  Constitutional: Negative for fever.  Respiratory: Negative for shortness of breath.   Cardiovascular: Negative for chest pain.  Gastrointestinal: Negative for diarrhea and vomiting.  Genitourinary: Positive for flank pain and hematuria. Negative for vaginal bleeding and vaginal  discharge.  Ten systems reviewed and are negative for acute change, except as noted in the HPI.    Physical Exam Updated Vital Signs BP 124/73   Pulse 93   Temp 98 F (36.7 C) (Oral)   Resp 18   Ht 5\' 4"  (1.626 m)   Wt 93 kg (205 lb)   LMP 03/24/2017   SpO2 99%   BMI 35.19 kg/m   Physical Exam  Constitutional: She is oriented to person, place, and time. She appears well-developed and well-nourished. No distress.  Nontoxic appearing and in NAD  HENT:  Head: Normocephalic and atraumatic.  Eyes: Conjunctivae and EOM are normal. No scleral icterus.  Neck: Normal range of motion.  Cardiovascular: Normal rate,  regular rhythm and intact distal pulses.   Pulmonary/Chest: Effort normal. No respiratory distress. She has no wheezes. She has no rales.  Respirations even and unlabored  Abdominal: Soft. She exhibits no distension and no mass. There is no tenderness. There is no guarding.  Soft, nontender, nondistended abdomen. No peritoneal signs.  Musculoskeletal: Normal range of motion.  Neurological: She is alert and oriented to person, place, and time. She exhibits normal muscle tone. Coordination normal.  Ambulatory with steady gait.  Skin: Skin is warm and dry. No rash noted. She is not diaphoretic. No erythema. No pallor.  Psychiatric: She has a normal mood and affect. Her behavior is normal.  Nursing note and vitals reviewed.    ED Treatments / Results  Labs (all labs ordered are listed, but only abnormal results are displayed) Labs Reviewed  CBC - Abnormal; Notable for the following:       Result Value   Hemoglobin 11.5 (*)    MCV 77.3 (*)    MCH 24.4 (*)    All other components within normal limits  BASIC METABOLIC PANEL    EKG  EKG Interpretation None       Radiology Ct Renal Stone Study  Result Date: 03/27/2017 CLINICAL DATA:  Subacute onset of left flank pain. Hematuria and left mid abdominal pain. Initial encounter. EXAM: CT ABDOMEN AND PELVIS WITHOUT CONTRAST TECHNIQUE: Multidetector CT imaging of the abdomen and pelvis was performed following the standard protocol without IV contrast. COMPARISON:  Abdominal ultrasound performed 09/12/2016 FINDINGS: Lower chest: Minimal bibasilar atelectasis is noted. The visualized portions of the mediastinum are unremarkable. Hepatobiliary: The liver is unremarkable in appearance. The gallbladder is unremarkable in appearance. The common bile duct remains normal in caliber. Pancreas: The pancreas is within normal limits. Spleen: The spleen is unremarkable in appearance. Adrenals/Urinary Tract: The adrenal glands are unremarkable in appearance.  The kidneys are within normal limits. There is no evidence of hydronephrosis. No renal or ureteral stones are identified. No perinephric stranding is seen. Stomach/Bowel: The stomach is unremarkable in appearance. The small bowel is within normal limits. The appendix is normal in caliber, without evidence of appendicitis. The colon is unremarkable in appearance. Vascular/Lymphatic: The abdominal aorta is unremarkable in appearance. The inferior vena cava is grossly unremarkable. No retroperitoneal lymphadenopathy is seen. No pelvic sidewall lymphadenopathy is identified. Reproductive: The bladder is mildly distended and grossly unremarkable. The uterus is unremarkable in appearance. The ovaries are relatively symmetric. No suspicious adnexal masses are seen. Other: No additional soft tissue abnormalities are seen. Musculoskeletal: No acute osseous abnormalities are identified. The visualized musculature is unremarkable in appearance. IMPRESSION: Unremarkable noncontrast CT of the abdomen and pelvis. Electronically Signed   By: Roanna Raider M.D.   On: 03/27/2017 23:38    Procedures  Procedures (including critical care time)  Medications Ordered in ED Medications - No data to display   Initial Impression / Assessment and Plan / ED Course  I have reviewed the triage vital signs and the nursing notes.  Pertinent labs & imaging results that were available during my care of the patient were reviewed by me and considered in my medical decision making (see chart for details).     21 year old female presenting to the emergency department for evaluation of left flank pain 3 weeks. She reports a history of kidney stones and notes that her pain today feels similar. She has been followed by a urologist in the past. She has never required surgical intervention for stone passage.  Laboratory workup and urinalysis reassuring. Kidney function preserved. Physical exam is largely unremarkable. Patient has been  calm and comfortable appearing since arrival. She has not required medications while in the ED for pain control.  CT performed which shows no evidence of ureterolithiasis or nephrolithiasis. Question musculoskeletal etiology as cause of symptoms. Doubt emergent etiology, especially in light of symptom chronicity. Will manage supportively and have the patient follow-up with her primary care doctor and specialist. Return precautions discussed and provided. Patient discharged in stable condition with no unaddressed concerns.   Final Clinical Impressions(s) / ED Diagnoses   Final diagnoses:  Left flank pain    New Prescriptions New Prescriptions   IBUPROFEN (ADVIL,MOTRIN) 600 MG TABLET    Take 1 tablet (600 mg total) by mouth every 6 (six) hours as needed.   TAMSULOSIN (FLOMAX) 0.4 MG CAPS CAPSULE    Take 1 capsule (0.4 mg total) by mouth daily.     Antony Madura, PA-C 03/28/17 1610    Bethann Berkshire, MD 03/28/17 [phone]

## 2017-03-28 MED ORDER — HYDROCODONE-ACETAMINOPHEN 5-325 MG PO TABS: 1.0000 | ORAL_TABLET | Freq: Four times a day (QID) | ORAL | 0 refills | Status: DC | PRN

## 2017-03-28 MED ORDER — TAMSULOSIN HCL 0.4 MG PO CAPS: 0.4000 mg | ORAL_CAPSULE | Freq: Every day | ORAL | 0 refills | Status: DC

## 2017-03-28 MED ORDER — IBUPROFEN 600 MG PO TABS: 600.0000 mg | ORAL_TABLET | Freq: Four times a day (QID) | ORAL | 0 refills | Status: DC | PRN

## 2017-03-28 NOTE — Discharge Instructions (Signed)
Your symptoms may be due to a muscle strain or spasm. We advise the use of ibuprofen for pain. Your CT did not suggest passage of a kidney stone; however, it is possible you have a very small stone not able to be visualized on CT. Take Flomax to promote stone movement. Follow-up with her primary care provider and urologist for a recheck of symptoms.

## 2017-03-29 LAB — URINE CULTURE

## 2018-08-23 ENCOUNTER — Encounter (HOSPITAL_COMMUNITY): Payer: Self-pay | Admitting: *Deleted

## 2018-08-23 ENCOUNTER — Emergency Department (HOSPITAL_COMMUNITY)
Admission: EM | Admit: 2018-08-23 | Discharge: 2018-08-23 | Disposition: A | Discharge status: Home / Self Care | Payer: 59 | Attending: Emergency Medicine | Admitting: Emergency Medicine

## 2018-08-23 ENCOUNTER — Other Ambulatory Visit: Payer: Self-pay

## 2018-08-23 DIAGNOSIS — R899 Unspecified abnormal finding in specimens from other organs, systems and tissues: Secondary | ICD-10-CM

## 2018-08-23 DIAGNOSIS — R799 Abnormal finding of blood chemistry, unspecified: Secondary | ICD-10-CM | POA: Diagnosis not present

## 2018-08-23 DIAGNOSIS — Z79899 Other long term (current) drug therapy: Secondary | ICD-10-CM | POA: Diagnosis not present

## 2018-08-23 LAB — CBC WITH DIFFERENTIAL/PLATELET
Abs Immature Granulocytes: 0.02 10*3/uL (ref 0.00–0.07)
Basophils Absolute: 0.1 10*3/uL (ref 0.0–0.1)
Basophils Relative: 1 %
Eosinophils Absolute: 0.1 10*3/uL (ref 0.0–0.5)
Eosinophils Relative: 1 %
HCT: 34.7 % — ABNORMAL LOW (ref 36.0–46.0)
Hemoglobin: 10.2 g/dL — ABNORMAL LOW (ref 12.0–15.0)
Immature Granulocytes: 0 %
Lymphocytes Relative: 33 %
Lymphs Abs: 2.7 10*3/uL (ref 0.7–4.0)
MCH: 22.3 pg — ABNORMAL LOW (ref 26.0–34.0)
MCHC: 29.4 g/dL — ABNORMAL LOW (ref 30.0–36.0)
MCV: 75.8 fL — ABNORMAL LOW (ref 80.0–100.0)
Monocytes Absolute: 0.8 10*3/uL (ref 0.1–1.0)
Monocytes Relative: 9 %
Neutro Abs: 4.6 10*3/uL (ref 1.7–7.7)
Neutrophils Relative %: 56 %
Platelets: 369 10*3/uL (ref 150–400)
RBC: 4.58 MIL/uL (ref 3.87–5.11)
RDW: 15.6 % — ABNORMAL HIGH (ref 11.5–15.5)
WBC: 8.3 10*3/uL (ref 4.0–10.5)
nRBC: 0 % (ref 0.0–0.2)

## 2018-08-23 LAB — COMPREHENSIVE METABOLIC PANEL
ALT: 15 U/L (ref 0–44)
AST: 28 U/L (ref 15–41)
Albumin: 3.9 g/dL (ref 3.5–5.0)
Alkaline Phosphatase: 85 U/L (ref 38–126)
Anion gap: 8 (ref 5–15)
BUN: 11 mg/dL (ref 6–20)
CO2: 24 mmol/L (ref 22–32)
Calcium: 9.4 mg/dL (ref 8.9–10.3)
Chloride: 105 mmol/L (ref 98–111)
Creatinine, Ser: 0.77 mg/dL (ref 0.44–1.00)
GFR calc Af Amer: >60 mL/min (ref >60)
GFR calc non Af Amer: >60 mL/min (ref >60)
Glucose, Bld: 89 mg/dL (ref 70–99)
Potassium: 4.3 mmol/L (ref 3.5–5.1)
Sodium: 137 mmol/L (ref 135–145)
Total Bilirubin: 0.7 mg/dL (ref 0.3–1.2)
Total Protein: 7.2 g/dL (ref 6.5–8.1)

## 2018-08-23 NOTE — ED Provider Notes (Signed)
[facility]   CSN: [id number] Arrival date & time: 08/23/18  0540     History   Chief Complaint Chief Complaint  Patient presents with  . Labs Only    HPI Council MechanicMeredith M Febo is a 22 y.o. female.  HPI  This is a 22 year old female with a history of IBS, migraines who presents with reported lab abnormality.  Patient reports that she was having a rheumatologic work-up for ongoing chronic pain and fatigue.  She had basic lab work obtained.  Per her report, she was called for platelet count of 3.  She also reports low white count and hemoglobin.  Patient denies any spontaneous bleeding.  No gum bleeding.  She denies heavy periods or easy bruising.  She denies any new symptoms.  She does deal with chronic fatigue.  She denies chest pain, shortness of breath, abdominal pain, nausea, vomiting.  Past Medical History:  Diagnosis Date  . Cystitis   . IBS (irritable bowel syndrome)   . Migraine   . Renal disorder    kidney stones    Patient Active Problem List   Diagnosis Date Noted  . Hypoxia   . Acute respiratory failure with hypoxia (HCC) 09/12/2016  . Anemia 09/12/2016  . Splenomegaly 09/12/2016  . Cystitis 09/12/2016    Past Surgical History:  Procedure Laterality Date  . WISDOM TOOTH EXTRACTION       OB History   None      Home Medications    Prior to Admission medications   Medication Sig Start Date End Date Taking? Authorizing Provider  ALPRAZolam Prudy Feeler(XANAX) 0.5 MG tablet Take 0.5 mg by mouth 2 (two) times daily as needed for anxiety.    [provider]  amitriptyline (ELAVIL) 10 MG tablet Take 10 mg by mouth at bedtime.    [provider]  esomeprazole (NEXIUM) 40 MG capsule Take 40 mg by mouth daily at 12 noon.    [provider]  ibuprofen (ADVIL,MOTRIN) 600 MG tablet Take 1 tablet (600 mg total) by mouth every 6 (six) hours as needed. 03/28/17   Antony MaduraHumes, Kelly, PA-C  loratadine  (CLARITIN) 10 MG tablet Take 10 mg by mouth daily.    [provider]  nitrofurantoin (MACRODANTIN) 100 MG capsule Take 100 mg by mouth daily.     [provider]  ondansetron (ZOFRAN) 4 MG tablet Take 1 tablet (4 mg total) by mouth every 6 (six) hours. 01/12/15   Raeford RazorKohut, Stephen, MD  pentosan polysulfate (ELMIRON) 100 MG capsule Take 1 capsule (100 mg total) by mouth 3 (three) times daily. 09/14/16   Hollice EspyKrishnan, Sendil K, MD  pregabalin (LYRICA) 150 MG capsule Take 150 mg by mouth 2 (two) times daily.    [provider]  ranitidine (ZANTAC) 150 MG tablet Take 150 mg by mouth 2 (two) times daily as needed for heartburn.     [provider]  tamsulosin (FLOMAX) 0.4 MG CAPS capsule Take 1 capsule (0.4 mg total) by mouth daily. 03/28/17   Antony MaduraHumes, Kelly, PA-C    Family History Family History  Problem Relation Age of Onset  . Diabetes Other   . CAD Other     Social History Social History   Tobacco Use  . Smoking status: Never Smoker  . Smokeless tobacco: Never Used  Substance Use Topics  . Alcohol use: No  . Drug use: No     Allergies   Diflucan [fluconazole] and Sulfa antibiotics   Review of  Systems Review of Systems  Constitutional: Negative for fever.  Respiratory: Negative for shortness of breath.   Cardiovascular: Negative for chest pain.  Gastrointestinal: Negative for abdominal pain, nausea and vomiting.  Genitourinary: Negative for dysuria.  Skin: Negative for color change.  All other systems reviewed and are negative.    Physical Exam Updated Vital Signs BP 125/70 (BP Location: Left Arm)   Pulse 96   Temp 98 F (36.7 C) (Oral)   Resp 14   Ht 1.626 m (5\' 4" )   Wt 104.3 kg   SpO2 99%   BMI 39.48 kg/m   Physical Exam  Constitutional: She is oriented to person, place, and time. She appears well-developed and well-nourished.  Obese, nontoxic-appearing, no acute distress  HENT:  Head: Normocephalic and atraumatic.  Neck: Neck  supple.  Cardiovascular: Normal rate, regular rhythm and normal heart sounds.  Pulmonary/Chest: Effort normal and breath sounds normal. No respiratory distress. She has no wheezes.  Abdominal: Soft. Bowel sounds are normal. There is no tenderness.  Neurological: She is alert and oriented to person, place, and time.  Skin: Skin is warm and dry.  No bruising noted  Psychiatric: She has a normal mood and affect.  Nursing note and vitals reviewed.    ED Treatments / Results  Labs (all labs ordered are listed, but only abnormal results are displayed) Labs Reviewed  CBC WITH DIFFERENTIAL/PLATELET - Abnormal; Notable for the following components:      Result Value   Hemoglobin 10.2 (*)    HCT 34.7 (*)    MCV 75.8 (*)    MCH 22.3 (*)    MCHC 29.4 (*)    RDW 15.6 (*)    All other components within normal limits  COMPREHENSIVE METABOLIC PANEL    EKG None  Radiology No results found.  Procedures Procedures (including critical care time)  Medications Ordered in ED Medications - No data to display   Initial Impression / Assessment and Plan / ED Course  I have reviewed the triage vital signs and the nursing notes.  Pertinent labs & imaging results that were available during my care of the patient were reviewed by me and considered in my medical decision making (see chart for details).     Patient presents with reported lab abnormalities.  She is nontoxic-appearing on exam and vital signs are reassuring.  She denies any new symptoms or any evidence of spontaneous bleeding.  Repeat lab work was obtained.  Repeat lab work is reassuring.  Platelet count is 369, hemoglobin is 10.2, white count is 8.3.  Suspect lab error.  Patient was reassured especially given absence of acute symptoms.  Follow-up routinely with primary physician and rheumatologist.  After history, exam, and medical workup I feel the patient has been appropriately medically screened and is safe for discharge home.  Pertinent diagnoses were discussed with the patient. Patient was given return precautions.   Final Clinical Impressions(s) / ED Diagnoses   Final diagnoses:  Abnormal laboratory test    ED Discharge Orders    None       Shon Baton, MD 08/23/18 [phone]

## 2018-08-23 NOTE — Discharge Instructions (Addendum)
Your repeat lab work here today is reassuring.  Your platelets are within normal range as is your white count.  Your hemoglobin is 10.2.  Follow back up with your primary physician as scheduled.

## 2018-08-23 NOTE — ED Triage Notes (Signed)
Patient states she had lab work drawn at her doctor office yest and was called last pm and told to come to the ED this am for evaluation of low platelet count.

## 2018-11-07 DIAGNOSIS — M2550 Pain in unspecified joint: Secondary | ICD-10-CM | POA: Diagnosis not present

## 2018-11-07 DIAGNOSIS — G894 Chronic pain syndrome: Secondary | ICD-10-CM | POA: Diagnosis not present

## 2018-11-07 DIAGNOSIS — R635 Abnormal weight gain: Secondary | ICD-10-CM | POA: Diagnosis not present

## 2018-11-07 DIAGNOSIS — F418 Other specified anxiety disorders: Secondary | ICD-10-CM | POA: Diagnosis not present

## 2018-11-30 DIAGNOSIS — R635 Abnormal weight gain: Secondary | ICD-10-CM | POA: Diagnosis not present

## 2018-11-30 DIAGNOSIS — E559 Vitamin D deficiency, unspecified: Secondary | ICD-10-CM | POA: Diagnosis not present

## 2018-11-30 DIAGNOSIS — Z13220 Encounter for screening for lipoid disorders: Secondary | ICD-10-CM | POA: Diagnosis not present

## 2018-12-21 DIAGNOSIS — E559 Vitamin D deficiency, unspecified: Secondary | ICD-10-CM | POA: Diagnosis not present

## 2018-12-21 DIAGNOSIS — F418 Other specified anxiety disorders: Secondary | ICD-10-CM | POA: Diagnosis not present

## 2018-12-21 DIAGNOSIS — G4701 Insomnia due to medical condition: Secondary | ICD-10-CM | POA: Diagnosis not present

## 2018-12-21 DIAGNOSIS — D550 Anemia due to glucose-6-phosphate dehydrogenase [G6PD] deficiency: Secondary | ICD-10-CM | POA: Diagnosis not present

## 2018-12-30 ENCOUNTER — Telehealth: Payer: Self-pay | Admitting: Internal Medicine

## 2018-12-30 ENCOUNTER — Encounter: Payer: Self-pay | Admitting: Internal Medicine

## 2018-12-30 NOTE — Telephone Encounter (Signed)
New hem referral from Jarrett Soho at [place] at [facility] for anemia. Unable to reach the pt bc her vm is full.

## 2018-12-30 NOTE — Telephone Encounter (Signed)
A new hem appt has been scheduled for the pt to see Dr. Melton Alar on 4/3 at 10:50am. I will mail the pt a letter since her vm is full.

## 2019-01-13 ENCOUNTER — Inpatient Hospital Stay: Payer: Self-pay | Admitting: Internal Medicine

## 2019-05-02 DIAGNOSIS — F418 Other specified anxiety disorders: Secondary | ICD-10-CM | POA: Diagnosis not present

## 2019-09-13 DIAGNOSIS — G894 Chronic pain syndrome: Secondary | ICD-10-CM | POA: Diagnosis not present

## 2019-09-13 DIAGNOSIS — F418 Other specified anxiety disorders: Secondary | ICD-10-CM | POA: Diagnosis not present

## 2019-09-13 DIAGNOSIS — M797 Fibromyalgia: Secondary | ICD-10-CM | POA: Diagnosis not present

## 2019-09-13 DIAGNOSIS — G4701 Insomnia due to medical condition: Secondary | ICD-10-CM | POA: Diagnosis not present

## 2020-06-27 DIAGNOSIS — G894 Chronic pain syndrome: Secondary | ICD-10-CM | POA: Diagnosis not present

## 2020-06-27 DIAGNOSIS — F418 Other specified anxiety disorders: Secondary | ICD-10-CM | POA: Diagnosis not present

## 2020-06-27 DIAGNOSIS — M797 Fibromyalgia: Secondary | ICD-10-CM | POA: Diagnosis not present

## 2020-06-27 DIAGNOSIS — G4701 Insomnia due to medical condition: Secondary | ICD-10-CM | POA: Diagnosis not present

## 2020-07-09 DIAGNOSIS — Z79899 Other long term (current) drug therapy: Secondary | ICD-10-CM | POA: Diagnosis not present

## 2020-07-09 DIAGNOSIS — E559 Vitamin D deficiency, unspecified: Secondary | ICD-10-CM | POA: Diagnosis not present

## 2020-07-09 DIAGNOSIS — D550 Anemia due to glucose-6-phosphate dehydrogenase [G6PD] deficiency: Secondary | ICD-10-CM | POA: Diagnosis not present

## 2020-07-09 DIAGNOSIS — R42 Dizziness and giddiness: Secondary | ICD-10-CM | POA: Diagnosis not present

## 2020-07-18 ENCOUNTER — Telehealth: Payer: Self-pay | Admitting: Hematology

## 2020-07-18 NOTE — Telephone Encounter (Signed)
Received a new hem referral from Jarrett Soho for anemia. Ms. Ressel returned my call and has been scheduled to see Dr. Candise Che on 10/26 at 11am. Pt aware to arrive 20 minutes early.

## 2020-08-02 ENCOUNTER — Telehealth: Payer: Self-pay | Admitting: Hematology

## 2020-08-02 DIAGNOSIS — N949 Unspecified condition associated with female genital organs and menstrual cycle: Secondary | ICD-10-CM | POA: Diagnosis not present

## 2020-08-02 DIAGNOSIS — Z124 Encounter for screening for malignant neoplasm of cervix: Secondary | ICD-10-CM | POA: Diagnosis not present

## 2020-08-02 DIAGNOSIS — N3010 Interstitial cystitis (chronic) without hematuria: Secondary | ICD-10-CM | POA: Diagnosis not present

## 2020-08-02 DIAGNOSIS — N809 Endometriosis, unspecified: Secondary | ICD-10-CM | POA: Diagnosis not present

## 2020-08-02 NOTE — Telephone Encounter (Signed)
Ms. Depriest has been rescheduled to see Dr. Candise Che on 11/9 at 11am. I cld and lft the new appt date and time on the pt's vm.

## 2020-08-06 ENCOUNTER — Inpatient Hospital Stay: Payer: Self-pay | Admitting: Hematology

## 2020-08-06 DIAGNOSIS — N949 Unspecified condition associated with female genital organs and menstrual cycle: Secondary | ICD-10-CM | POA: Diagnosis not present

## 2020-08-20 ENCOUNTER — Inpatient Hospital Stay: Payer: Medicaid Other | Admitting: Hematology

## 2020-08-22 ENCOUNTER — Telehealth: Payer: Self-pay | Admitting: *Deleted

## 2020-08-22 ENCOUNTER — Telehealth: Payer: Self-pay | Admitting: Hematology

## 2020-08-22 NOTE — Telephone Encounter (Signed)
Patient had New Patient appt with Dr. Candise Che scheduled for 11/9 at 11am. Did not come. 08/22/20--Received Telephone Advice fax from after hours [facility], fax stamp 08/20/20: "Caller has upcoming appt on 11/11 - her husband will not be able to being her and she wants to know can she reschedule for the next day Friday 11/12?" New Patient appt not available with provider on 11/12.  Staff message sent to New Patient coordinator with request to contact patient to reschedule appt.

## 2020-08-22 NOTE — Telephone Encounter (Signed)
I cld and lft Mary Orozco a vm to reschedule her new hem appt w/Dr. Candise Che

## 2020-08-27 ENCOUNTER — Telehealth: Payer: Self-pay | Admitting: Hematology

## 2020-08-27 NOTE — Telephone Encounter (Signed)
Pt cld to r/s her new hem appt to 12/9 at 11am to see Dr. Candise Che

## 2020-09-11 DIAGNOSIS — N809 Endometriosis, unspecified: Secondary | ICD-10-CM | POA: Diagnosis not present

## 2020-09-19 ENCOUNTER — Inpatient Hospital Stay: Payer: BC Managed Care – PPO | Attending: Hematology | Admitting: Hematology

## 2020-09-19 ENCOUNTER — Other Ambulatory Visit: Payer: Self-pay

## 2020-09-19 ENCOUNTER — Inpatient Hospital Stay: Payer: BC Managed Care – PPO

## 2020-09-19 VITALS — BP 111/58 | HR 79 | Temp 97.7°F | Resp 18 | Ht 64.0 in | Wt 255.2 lb

## 2020-09-19 DIAGNOSIS — R109 Unspecified abdominal pain: Secondary | ICD-10-CM | POA: Diagnosis not present

## 2020-09-19 DIAGNOSIS — D500 Iron deficiency anemia secondary to blood loss (chronic): Secondary | ICD-10-CM

## 2020-09-19 DIAGNOSIS — M797 Fibromyalgia: Secondary | ICD-10-CM | POA: Insufficient documentation

## 2020-09-19 DIAGNOSIS — E559 Vitamin D deficiency, unspecified: Secondary | ICD-10-CM | POA: Diagnosis not present

## 2020-09-19 DIAGNOSIS — M1990 Unspecified osteoarthritis, unspecified site: Secondary | ICD-10-CM | POA: Insufficient documentation

## 2020-09-19 DIAGNOSIS — D75A Glucose-6-phosphate dehydrogenase (G6PD) deficiency without anemia: Secondary | ICD-10-CM | POA: Diagnosis not present

## 2020-09-19 DIAGNOSIS — D509 Iron deficiency anemia, unspecified: Secondary | ICD-10-CM | POA: Insufficient documentation

## 2020-09-19 DIAGNOSIS — R2689 Other abnormalities of gait and mobility: Secondary | ICD-10-CM | POA: Insufficient documentation

## 2020-09-19 DIAGNOSIS — E538 Deficiency of other specified B group vitamins: Secondary | ICD-10-CM

## 2020-09-19 DIAGNOSIS — Z79899 Other long term (current) drug therapy: Secondary | ICD-10-CM | POA: Insufficient documentation

## 2020-09-19 DIAGNOSIS — R5383 Other fatigue: Secondary | ICD-10-CM | POA: Insufficient documentation

## 2020-09-19 DIAGNOSIS — N809 Endometriosis, unspecified: Secondary | ICD-10-CM | POA: Insufficient documentation

## 2020-09-19 LAB — CMP (CANCER CENTER ONLY)
ALT: 10 U/L (ref 0–44)
AST: 10 U/L — ABNORMAL LOW (ref 15–41)
Albumin: 3.7 g/dL (ref 3.5–5.0)
Alkaline Phosphatase: 94 U/L (ref 38–126)
Anion gap: 9 (ref 5–15)
BUN: 9 mg/dL (ref 6–20)
CO2: 25 mmol/L (ref 22–32)
Calcium: 9.6 mg/dL (ref 8.9–10.3)
Chloride: 105 mmol/L (ref 98–111)
Creatinine: 0.77 mg/dL (ref 0.44–1.00)
GFR, Estimated: >60 mL/min (ref >60)
Glucose, Bld: 93 mg/dL (ref 70–99)
Potassium: 4.1 mmol/L (ref 3.5–5.1)
Sodium: 139 mmol/L (ref 135–145)
Total Bilirubin: 0.2 mg/dL — ABNORMAL LOW (ref 0.3–1.2)
Total Protein: 8 g/dL (ref 6.5–8.1)

## 2020-09-19 LAB — CBC WITH DIFFERENTIAL/PLATELET
Abs Immature Granulocytes: 0.02 10*3/uL (ref 0.00–0.07)
Basophils Absolute: 0 10*3/uL (ref 0.0–0.1)
Basophils Relative: 1 %
Eosinophils Absolute: 0.1 10*3/uL (ref 0.0–0.5)
Eosinophils Relative: 2 %
HCT: 30.3 % — ABNORMAL LOW (ref 36.0–46.0)
Hemoglobin: 8.3 g/dL — ABNORMAL LOW (ref 12.0–15.0)
Immature Granulocytes: 0 %
Lymphocytes Relative: 29 %
Lymphs Abs: 1.8 10*3/uL (ref 0.7–4.0)
MCH: 17.5 pg — ABNORMAL LOW (ref 26.0–34.0)
MCHC: 27.4 g/dL — ABNORMAL LOW (ref 30.0–36.0)
MCV: 63.9 fL — ABNORMAL LOW (ref 80.0–100.0)
Monocytes Absolute: 0.5 10*3/uL (ref 0.1–1.0)
Monocytes Relative: 8 %
Neutro Abs: 3.7 10*3/uL (ref 1.7–7.7)
Neutrophils Relative %: 60 %
Platelets: 368 10*3/uL (ref 150–400)
RBC: 4.74 MIL/uL (ref 3.87–5.11)
RDW: 19.3 % — ABNORMAL HIGH (ref 11.5–15.5)
WBC: 6.1 10*3/uL (ref 4.0–10.5)
nRBC: 0 % (ref 0.0–0.2)

## 2020-09-19 LAB — RETICULOCYTES
Immature Retic Fract: 16.4 % — ABNORMAL HIGH (ref 2.3–15.9)
RBC.: 4.76 MIL/uL (ref 3.87–5.11)
Retic Count, Absolute: 50.9 10*3/uL (ref 19.0–186.0)
Retic Ct Pct: 1.1 % (ref 0.4–3.1)

## 2020-09-19 LAB — IRON AND TIBC
Iron: 13 ug/dL — ABNORMAL LOW (ref 41–142)
Saturation Ratios: 2 % — ABNORMAL LOW (ref 21–57)
TIBC: 546 ug/dL — ABNORMAL HIGH (ref 236–444)
UIBC: 533 ug/dL — ABNORMAL HIGH (ref 120–384)

## 2020-09-19 LAB — VITAMIN B12: Vitamin B-12: 211 pg/mL (ref 180–914)

## 2020-09-19 LAB — SAMPLE TO BLOOD BANK

## 2020-09-19 LAB — VITAMIN D 25 HYDROXY (VIT D DEFICIENCY, FRACTURES): Vit D, 25-Hydroxy: 28 ng/mL — ABNORMAL LOW (ref 30–100)

## 2020-09-19 LAB — LACTATE DEHYDROGENASE: LDH: 168 U/L (ref 98–192)

## 2020-09-19 LAB — FERRITIN: Ferritin: <4 ng/mL — ABNORMAL LOW (ref 11–307)

## 2020-09-19 MED ORDER — B COMPLEX VITAMINS PO CAPS: 1.0000 | ORAL_CAPSULE | Freq: Every day | ORAL | 3 refills | Status: AC

## 2020-09-19 MED ORDER — ERGOCALCIFEROL 1.25 MG (50000 UT) PO CAPS: 50000.0000 [IU] | ORAL_CAPSULE | ORAL | 1 refills | Status: DC

## 2020-09-19 MED ORDER — B-12 1000 MCG SL SUBL: 2000.0000 ug | SUBLINGUAL_TABLET | Freq: Every day | SUBLINGUAL | 3 refills | Status: DC

## 2020-09-19 NOTE — Patient Instructions (Signed)
Thank you for choosing [facility] to provide your oncology and hematology care.   Should you have questions after your visit to the [facility] ([facility]), please contact this office at [phone] between 8:30 AM and 4:30 PM.  Voice mails left after 4:00 PM may not be returned until the following business day.  Calls received after 4:30 PM will be answered by an off-site Nurse Triage Line.    Prescription Refills:  Please have your pharmacy contact us directly for most prescription requests.  Contact the office directly for refills of narcotics (pain medications). Allow 48-72 hours for refills.  Appointments: Please contact the [facility] scheduling department [phone] for questions regarding [facility] appointment scheduling.  Contact the schedulers with any scheduling changes so that your appointment can be rescheduled in a timely manner.   Central Scheduling for [place] [phone] - Call to schedule procedures such as PET scans, CT scans, MRI, Ultrasound, etc.  To afford each patient quality time with our providers, please arrive 30 minutes before your scheduled appointment time.  If you arrive late for your appointment, you may be asked to reschedule.  We strive to give you quality time with our providers, and arriving late affects you and other patients whose appointments are after yours. If you are a no show for multiple scheduled visits, you may be dismissed from the clinic at the providers discretion.     Resources: [facility] Social Workers [phone] for additional information on assistance programs or assistance connecting with community support programs   [place] DSS  336-641-3447: Information regarding food stamps, Medicaid, and utility assistance [place] [employer] [place] [phone]   [employer] [employer]'s shared-ride transportation service for eligible riders who have a disability that prevents them from riding the fixed route bus.   Medicare  Rights Center 800-333-4114 Helps people with Medicare understand their rights and benefits, navigate the Medicare system, and secure the quality healthcare they deserve [employer] 800-227-2345 Assists patients locate various types of support and financial assistance Cancer Care: 1-800-813-HOPE (4673) Provides financial assistance, online support groups, medication/co-pay assistance.   Transportation Assistance for appointments at [facility]: Transportation Coordinator [phone]  Again, thank you for choosing [facility] for your care.       

## 2020-09-19 NOTE — Progress Notes (Signed)
HEMATOLOGY/ONCOLOGY CONSULTATION NOTE  Date of Service: 09/19/2020  Patient Care Team: System, Provider Not In as PCP - General  CHIEF COMPLAINTS/PURPOSE OF CONSULTATION:  Anemia  HISTORY OF PRESENTING ILLNESS:  Mary Orozco is a wonderful 24 y.o. female who has been referred to [place] by Jarrett Soho, PA-C for evaluation and management of anemia. Pt is accompanied today by her husband. The pt reports that she is doing well overall.   The pt reports that she went to the hospital in December of 2017 for dysuria and pelvis pain. Pt has a history of intersitial cystitis. During this time she was found to have an oxygen saturation of 85%. Hemolysis and methemoglobinemia were also concerns at this time. The hospitalist were suspcious of G6PD deficiency, which the pt reports was confirmed on multiple labs after discharge. She has been on Macrodantin daily since 2017.  Pt has heavy menstrual cycles and endometriosis. Her menstrual cycles last 4-5 days, with two being extremely heavy. She started [place] four weeks ago. She had heavy nose bleeds as a child which improved as aged. Pt took Floradix for a year with no correction or significant improvement in her blood counts. She has previously used 50K IU Vitamin D weekly, which did not do much to improve her Vitamin D deficiency. She is currently getting her Vitamin D and Vitamin B12 supplementation in the form of gummies.   Pt has been diagnosed with Fibromyalgia and Arthritis. She was seen by Rheumatology who felt there were no concerns for any autoimmune disorders. Pt has diarrhea predominant IBS and had been hospitalized for gastritis in the past. She has been on Omeprazole and Zantac to control her GERD for a few years. She denies having a gluten intolerance. Pt reports rash with Diflucan and Sulfa antibiotics. She denies any anaphylaxis with these medications.   Most recent lab results (07/08/2020) of CBC is as follows: all values are WNL  except for Hgb at 8.8, HCT at 28.6, MCV at 59.4, MCH at 18.3, MCHC at 30.8, RDW at 19.5, PLT at 473K, Baso Rel at 1.1, Total Bilirubin at 0.2. 07/08/2020 TIBC at 578, Iron at 15, Iron Sat at 3, Transferrin at 413 07/08/2020 Vitamin D at 17.6 06/27/2020 Vitamin B12 at 184  On review of systems, pt reports fatigue, imbalance, abdominal pain and denies unexpected weight loss, leg swelling and any other symptoms.   On PMHx the pt reports GERD, IBS, Fibromyalgia, Arthritis, Menorrhagia, Interstitial Cystitis, G6PD deficiency, Endometriosis, IDA, Vitamin D deficiency, Vitamin B12 deficiency. On Family Hx the pt reports no bleeding disorders. Her mother, aunt, and grandmother have thyroid disorders.   MEDICAL HISTORY:  Past Medical History:  Diagnosis Date  . Cystitis   . IBS (irritable bowel syndrome)   . Migraine   . Renal disorder    kidney stones    SURGICAL HISTORY: Past Surgical History:  Procedure Laterality Date  . WISDOM TOOTH EXTRACTION      SOCIAL HISTORY: Social History   Socioeconomic History  . Marital status: Married    Spouse name: Not on file  . Number of children: Not on file  . Years of education: Not on file  . Highest education level: Not on file  Occupational History  . Not on file  Tobacco Use  . Smoking status: Never Smoker  . Smokeless tobacco: Never Used  Substance and Sexual Activity  . Alcohol use: No  . Drug use: No  . Sexual activity: Yes  Other Topics Concern  .  Not on file  Social History Narrative  . Not on file   Social Determinants of Health   Financial Resource Strain: Not on file  Food Insecurity: Not on file  Transportation Needs: Not on file  Physical Activity: Not on file  Stress: Not on file  Social Connections: Not on file  Intimate Partner Violence: Not on file    FAMILY HISTORY: Family History  Problem Relation Age of Onset  . Diabetes Other   . CAD Other     ALLERGIES:  is allergic to diflucan [fluconazole],  sulfa antibiotics, and sulfasalazine.  MEDICATIONS:  Current Outpatient Medications  Medication Sig Dispense Refill  . ALPRAZolam (XANAX) 0.5 MG tablet Take 0.5 mg by mouth 2 (two) times daily as needed for anxiety.    Marland Kitchen esomeprazole (NEXIUM) 40 MG capsule Take 40 mg by mouth daily at 12 noon.    . nitrofurantoin (MACRODANTIN) 100 MG capsule Take 100 mg by mouth daily.     . ondansetron (ZOFRAN) 4 MG tablet Take 1 tablet (4 mg total) by mouth every 6 (six) hours. 12 tablet 0  . pregabalin (LYRICA) 150 MG capsule Take 150 mg by mouth 2 (two) times daily.    Marland Kitchen amitriptyline (ELAVIL) 10 MG tablet Take 10 mg by mouth at bedtime.    Marland Kitchen b complex vitamins capsule Take 1 capsule by mouth daily. 30 capsule 3  . Cyanocobalamin (B-12) 1000 MCG SUBL Place 2,000 mcg under the tongue daily. 30 tablet 3  . ergocalciferol (VITAMIN D2) 1.25 MG (50000 UT) capsule Take 1 capsule (50,000 Units total) by mouth 2 (two) times a week. 24 capsule 1  . ibuprofen (ADVIL,MOTRIN) 600 MG tablet Take 1 tablet (600 mg total) by mouth every 6 (six) hours as needed. 30 tablet 0  . loratadine (CLARITIN) 10 MG tablet Take 10 mg by mouth daily.    . pentosan polysulfate (ELMIRON) 100 MG capsule Take 1 capsule (100 mg total) by mouth 3 (three) times daily. 90 capsule 2  . ranitidine (ZANTAC) 150 MG tablet Take 150 mg by mouth 2 (two) times daily as needed for heartburn.     . tamsulosin (FLOMAX) 0.4 MG CAPS capsule Take 1 capsule (0.4 mg total) by mouth daily. 5 capsule 0   No current facility-administered medications for this visit.    REVIEW OF SYSTEMS:    10 Point review of Systems was done is negative except as noted above.  PHYSICAL EXAMINATION: ECOG PERFORMANCE STATUS: 2 - Symptomatic, <50% confined to bed  . Vitals:   09/19/20 1120  BP: (!) 111/58  Pulse: 79  Resp: 18  Temp: 97.7 F (36.5 C)  SpO2: 98%   Filed Weights   09/19/20 1120  Weight: 255 lb 3.2 oz (115.8 kg)   .Body mass index is 43.8  kg/m.  Exam was given in a wheelchair   GENERAL:alert, in no acute distress and comfortable SKIN: no acute rashes, no significant lesions EYES: conjunctiva are pink and non-injected, sclera anicteric OROPHARYNX: MMM, no exudates, no oropharyngeal erythema or ulceration NECK: supple, no JVD LYMPH:  no palpable lymphadenopathy in the cervical, axillary or inguinal regions LUNGS: clear to auscultation b/l with normal respiratory effort HEART: regular rate & rhythm ABDOMEN:  normoactive bowel sounds , non tender, not distended. Extremity: no pedal edema PSYCH: alert & oriented x 3 with fluent speech NEURO: no focal motor/sensory deficits  LABORATORY DATA:  I have reviewed the data as listed  . CBC Latest Ref Rng & Units 09/19/2020 08/23/2018  03/27/2017  WBC 4.0 - 10.5 K/uL 6.1 8.3 9.2  Hemoglobin 12.0 - 15.0 g/dL 8.3(L) 10.2(L) 11.5(L)  Hematocrit 36.0 - 46.0 % 30.3(L) 34.7(L) 36.4  Platelets 150 - 400 K/uL 368 369 362    . CMP Latest Ref Rng & Units 09/19/2020 08/23/2018 03/27/2017  Glucose 70 - 99 mg/dL 93 89 96  BUN 6 - 20 mg/dL 9 11 7   Creatinine 0.44 - 1.00 mg/dL 1.610.77 0.960.77 0.450.72  Sodium 135 - 145 mmol/L 139 137 135  Potassium 3.5 - 5.1 mmol/L 4.1 4.3 4.1  Chloride 98 - 111 mmol/L 105 105 102  CO2 22 - 32 mmol/L 25 24 25   Calcium 8.9 - 10.3 mg/dL 9.6 9.4 9.1  Total Protein 6.5 - 8.1 g/dL 8.0 7.2 -  Total Bilirubin 0.3 - 1.2 mg/dL 4.0(J0.2(L) 0.7 -  Alkaline Phos 38 - 126 U/L 94 85 -  AST 15 - 41 U/L 10(L) 28 -  ALT 0 - 44 U/L 10 15 -     RADIOGRAPHIC STUDIES: I have personally reviewed the radiological images as listed and agreed with the findings in the report. No results found.  ASSESSMENT & PLAN:  24 yo with   1) Severe Iron deficiency Anemia 2) B12 deficiency 3) Vit D deficiency PLAN: -Discussed patient's most recent labs from 07/08/2020, all values are WNL except for Hgb at 8.8, HCT at 28.6, MCV at 59.4, MCH at 18.3, MCHC at 30.8, RDW at 19.5, PLT at 473K, Baso  Rel at 1.1, Total Bilirubin at 0.2. -Discussed 07/08/2020 TIBC at 578, Iron at 15, Iron Sat at 3, Transferrin at 413 -Discussed 07/08/2020 Vitamin D at 17.6 -Discussed 06/27/2020 Vitamin B12 at 184 -Advised pt that microcytic anemia suggests iron and/or vitamin B12 deficiency causing anemia.  -Advised pt that chronic acid suppression can impair nutrient absorption, which can lead to cytopenias.  -Advised pt that we can try PO Iron Polysaccharide, which is typically tolerated well, to see if we can correct iron deficiency.  -Advised pt that it will take a significant amount of time to correct iron deficiency with PO Iron, especially given ongoing menstrual losses.  -Advised pt that IV Iron can replace iron faster, but does come with the risk of an allergic reaction. Would premedicate to prevent this.  -Advised pt that if she has malabsorption issues and continues to experience significant menstrual losses IV Iron may be continued prn.  -Goal Vitamin B12 >300-400. Recommend pt begin 2000 mcg SL Vitamin B12 daily.  -Goal Vitamin D is between 60-90. Recommend pt begin 50K IU Vitamin D twice weekly. -Recommend pt take B-complex for at least 3 months.  -Recommend pt f/u with PCP for Macrodantin management. -Will get labs today - try to r/o pernicious anemia -Will give IV Injectafer weekly x2  -Rx Vitamin B12, Vitamin D -Will see back in 3 months with labs   FOLLOW UP: Labs today IV Injectafer weekly x 2 doses ASAP RTC with Dr Candise CheKale with labs in 3 months   All of the patients questions were answered with apparent satisfaction. The patient knows to call the clinic with any problems, questions or concerns.  I spent 35 mins counseling the patient face to face. The total time spent in the appointment was 45 minutes and more than 50% was on counseling and direct patient cares.    Wyvonnia LoraGautam Olamide Carattini MD MS AAHIVMS [facility] Hematology/Oncology Physician [facility]  (Office):        [phone] (Work cell):  [phone] (Fax):  [phone]  09/19/2020 5:15 PM  I, Carollee Herter, am acting as a scribe for Dr. Wyvonnia Lora.   .I have reviewed the above documentation for accuracy and completeness, and I agree with the above. Johney Maine MD

## 2020-09-20 ENCOUNTER — Telehealth: Payer: Self-pay | Admitting: Hematology

## 2020-09-20 LAB — HAPTOGLOBIN: Haptoglobin: 193 mg/dL (ref 33–278)

## 2020-09-20 LAB — ANTI-PARIETAL ANTIBODY: Parietal Cell Antibody-IgG: 1.1 Units (ref 0.0–20.0)

## 2020-09-20 LAB — GLUCOSE 6 PHOSPHATE DEHYDROGENASE
G6PDH: 19.6 U/g{Hb} — ABNORMAL HIGH (ref 4.7–14.6)
Hemoglobin: 8.4 g/dL — ABNORMAL LOW (ref 11.1–15.9)

## 2020-09-20 LAB — INTRINSIC FACTOR ANTIBODIES: Intrinsic Factor: 1.1 AU/mL (ref 0.0–1.1)

## 2020-09-20 NOTE — Telephone Encounter (Signed)
Scheduled per 12/09 los, patient has been called but voicemail is full. Calender will be mailed.

## 2020-09-25 ENCOUNTER — Telehealth: Payer: Self-pay | Admitting: *Deleted

## 2020-09-25 NOTE — Telephone Encounter (Signed)
Patient's insurance preferred IV iron is Venofer. Venofer needs 5 doses. Originally scheduled for 2 doses of IV Injectafer. Schedule message sent to add 3 more appts for patient. Patient informed of all information and need for 3 additional appointments.

## 2020-09-26 ENCOUNTER — Other Ambulatory Visit: Payer: Self-pay

## 2020-09-26 ENCOUNTER — Inpatient Hospital Stay: Payer: BC Managed Care – PPO

## 2020-09-26 VITALS — BP 112/63 | HR 85 | Temp 99.0°F | Resp 18

## 2020-09-26 DIAGNOSIS — M797 Fibromyalgia: Secondary | ICD-10-CM | POA: Diagnosis not present

## 2020-09-26 DIAGNOSIS — R109 Unspecified abdominal pain: Secondary | ICD-10-CM | POA: Diagnosis not present

## 2020-09-26 DIAGNOSIS — R2689 Other abnormalities of gait and mobility: Secondary | ICD-10-CM | POA: Diagnosis not present

## 2020-09-26 DIAGNOSIS — D509 Iron deficiency anemia, unspecified: Secondary | ICD-10-CM | POA: Diagnosis not present

## 2020-09-26 DIAGNOSIS — R5383 Other fatigue: Secondary | ICD-10-CM | POA: Diagnosis not present

## 2020-09-26 DIAGNOSIS — M1990 Unspecified osteoarthritis, unspecified site: Secondary | ICD-10-CM | POA: Diagnosis not present

## 2020-09-26 DIAGNOSIS — D500 Iron deficiency anemia secondary to blood loss (chronic): Secondary | ICD-10-CM

## 2020-09-26 DIAGNOSIS — Z79899 Other long term (current) drug therapy: Secondary | ICD-10-CM | POA: Diagnosis not present

## 2020-09-26 DIAGNOSIS — N809 Endometriosis, unspecified: Secondary | ICD-10-CM | POA: Diagnosis not present

## 2020-09-26 MED ORDER — ACETAMINOPHEN 325 MG PO TABS
650.0000 mg | ORAL_TABLET | Freq: Once | ORAL | Status: AC
  Administered 2020-09-26: 650 mg via ORAL

## 2020-09-26 MED ORDER — SODIUM CHLORIDE 0.9 % IV SOLN
Freq: Once | INTRAVENOUS | Status: AC
Start: 2020-09-26 — End: 2020-09-26
  Filled 2020-09-26: qty 250

## 2020-09-26 MED ORDER — SODIUM CHLORIDE 0.9 % IV SOLN
200.0000 mg | Freq: Once | INTRAVENOUS | Status: AC
  Administered 2020-09-26: 200 mg via INTRAVENOUS
  Filled 2020-09-26: qty 200

## 2020-09-26 MED ORDER — ACETAMINOPHEN 325 MG PO TABS
ORAL_TABLET | ORAL | Status: AC
  Filled 2020-09-26: qty 2

## 2020-09-26 MED ORDER — LORATADINE 10 MG PO TABS
10.0000 mg | ORAL_TABLET | Freq: Once | ORAL | Status: AC
  Administered 2020-09-26: 10 mg via ORAL

## 2020-09-26 MED ORDER — LORATADINE 10 MG PO TABS
ORAL_TABLET | ORAL | Status: AC
  Filled 2020-09-26: qty 1

## 2020-09-26 NOTE — Patient Instructions (Signed)

## 2020-09-26 NOTE — Progress Notes (Signed)
Pt tolerated tx well, stayed for 30 min post iron w/out any issues.  VS stable.

## 2020-10-02 ENCOUNTER — Inpatient Hospital Stay: Payer: BC Managed Care – PPO

## 2020-10-03 ENCOUNTER — Other Ambulatory Visit: Payer: Self-pay | Admitting: Hematology

## 2020-10-09 ENCOUNTER — Inpatient Hospital Stay: Payer: BC Managed Care – PPO

## 2020-10-11 ENCOUNTER — Other Ambulatory Visit: Payer: Self-pay

## 2020-10-11 ENCOUNTER — Inpatient Hospital Stay: Payer: BC Managed Care – PPO

## 2020-10-11 VITALS — BP 106/47 | HR 76 | Temp 97.0°F | Resp 16

## 2020-10-11 DIAGNOSIS — Z79899 Other long term (current) drug therapy: Secondary | ICD-10-CM | POA: Diagnosis not present

## 2020-10-11 DIAGNOSIS — R2689 Other abnormalities of gait and mobility: Secondary | ICD-10-CM | POA: Diagnosis not present

## 2020-10-11 DIAGNOSIS — D500 Iron deficiency anemia secondary to blood loss (chronic): Secondary | ICD-10-CM

## 2020-10-11 DIAGNOSIS — M797 Fibromyalgia: Secondary | ICD-10-CM | POA: Diagnosis not present

## 2020-10-11 DIAGNOSIS — M1990 Unspecified osteoarthritis, unspecified site: Secondary | ICD-10-CM | POA: Diagnosis not present

## 2020-10-11 DIAGNOSIS — N809 Endometriosis, unspecified: Secondary | ICD-10-CM | POA: Diagnosis not present

## 2020-10-11 DIAGNOSIS — R5383 Other fatigue: Secondary | ICD-10-CM | POA: Diagnosis not present

## 2020-10-11 DIAGNOSIS — D509 Iron deficiency anemia, unspecified: Secondary | ICD-10-CM | POA: Diagnosis not present

## 2020-10-11 DIAGNOSIS — R109 Unspecified abdominal pain: Secondary | ICD-10-CM | POA: Diagnosis not present

## 2020-10-11 MED ORDER — LORATADINE 10 MG PO TABS
10.0000 mg | ORAL_TABLET | Freq: Once | ORAL | Status: AC
  Administered 2020-10-11: 10 mg via ORAL

## 2020-10-11 MED ORDER — LORATADINE 10 MG PO TABS
ORAL_TABLET | ORAL | Status: AC
  Filled 2020-10-11: qty 1

## 2020-10-11 MED ORDER — SODIUM CHLORIDE 0.9 % IV SOLN
200.0000 mg | Freq: Once | INTRAVENOUS | Status: AC
  Administered 2020-10-11: 200 mg via INTRAVENOUS
  Filled 2020-10-11: qty 200

## 2020-10-11 MED ORDER — ACETAMINOPHEN 325 MG PO TABS
650.0000 mg | ORAL_TABLET | Freq: Once | ORAL | Status: AC
  Administered 2020-10-11: 650 mg via ORAL

## 2020-10-11 MED ORDER — SODIUM CHLORIDE 0.9 % IV SOLN
Freq: Once | INTRAVENOUS | Status: AC
  Filled 2020-10-11: qty 250

## 2020-10-11 MED ORDER — ACETAMINOPHEN 325 MG PO TABS
ORAL_TABLET | ORAL | Status: AC
  Filled 2020-10-11: qty 2

## 2020-10-11 NOTE — Patient Instructions (Signed)

## 2020-10-16 ENCOUNTER — Inpatient Hospital Stay: Payer: BC Managed Care – PPO

## 2020-10-18 ENCOUNTER — Other Ambulatory Visit: Payer: Self-pay

## 2020-10-18 ENCOUNTER — Inpatient Hospital Stay: Payer: BC Managed Care – PPO | Attending: Hematology

## 2020-10-18 VITALS — BP 106/71 | HR 76 | Temp 98.4°F | Resp 18

## 2020-10-18 DIAGNOSIS — N920 Excessive and frequent menstruation with regular cycle: Secondary | ICD-10-CM | POA: Diagnosis not present

## 2020-10-18 DIAGNOSIS — D500 Iron deficiency anemia secondary to blood loss (chronic): Secondary | ICD-10-CM | POA: Diagnosis not present

## 2020-10-18 DIAGNOSIS — N809 Endometriosis, unspecified: Secondary | ICD-10-CM | POA: Insufficient documentation

## 2020-10-18 DIAGNOSIS — E538 Deficiency of other specified B group vitamins: Secondary | ICD-10-CM | POA: Insufficient documentation

## 2020-10-18 MED ORDER — LORATADINE 10 MG PO TABS
10.0000 mg | ORAL_TABLET | Freq: Once | ORAL | Status: AC
  Administered 2020-10-18: 10 mg via ORAL

## 2020-10-18 MED ORDER — ACETAMINOPHEN 325 MG PO TABS
ORAL_TABLET | ORAL | Status: AC
  Filled 2020-10-18: qty 2

## 2020-10-18 MED ORDER — ACETAMINOPHEN 325 MG PO TABS
650.0000 mg | ORAL_TABLET | Freq: Once | ORAL | Status: AC
  Administered 2020-10-18: 650 mg via ORAL

## 2020-10-18 MED ORDER — LORATADINE 10 MG PO TABS
ORAL_TABLET | ORAL | Status: AC
  Filled 2020-10-18: qty 1

## 2020-10-18 MED ORDER — SODIUM CHLORIDE 0.9 % IV SOLN
Freq: Once | INTRAVENOUS | Status: AC
  Filled 2020-10-18: qty 250

## 2020-10-18 MED ORDER — SODIUM CHLORIDE 0.9 % IV SOLN
200.0000 mg | Freq: Once | INTRAVENOUS | Status: AC
  Administered 2020-10-18: 200 mg via INTRAVENOUS
  Filled 2020-10-18: qty 200

## 2020-10-18 NOTE — Patient Instructions (Signed)

## 2020-10-23 ENCOUNTER — Ambulatory Visit: Payer: BC Managed Care – PPO

## 2020-10-25 ENCOUNTER — Inpatient Hospital Stay: Payer: BC Managed Care – PPO

## 2020-10-25 ENCOUNTER — Other Ambulatory Visit: Payer: Self-pay

## 2020-10-25 VITALS — BP 115/67 | HR 85 | Temp 98.2°F | Resp 18

## 2020-10-25 DIAGNOSIS — D500 Iron deficiency anemia secondary to blood loss (chronic): Secondary | ICD-10-CM

## 2020-10-25 DIAGNOSIS — N809 Endometriosis, unspecified: Secondary | ICD-10-CM | POA: Diagnosis not present

## 2020-10-25 DIAGNOSIS — E538 Deficiency of other specified B group vitamins: Secondary | ICD-10-CM | POA: Diagnosis not present

## 2020-10-25 DIAGNOSIS — N920 Excessive and frequent menstruation with regular cycle: Secondary | ICD-10-CM | POA: Diagnosis not present

## 2020-10-25 MED ORDER — LORATADINE 10 MG PO TABS
ORAL_TABLET | ORAL | Status: AC
  Filled 2020-10-25: qty 1

## 2020-10-25 MED ORDER — ACETAMINOPHEN 325 MG PO TABS
ORAL_TABLET | ORAL | Status: AC
  Filled 2020-10-25: qty 2

## 2020-10-25 MED ORDER — SODIUM CHLORIDE 0.9 % IV SOLN
Freq: Once | INTRAVENOUS | Status: AC
  Filled 2020-10-25: qty 250

## 2020-10-25 MED ORDER — ACETAMINOPHEN 325 MG PO TABS
650.0000 mg | ORAL_TABLET | Freq: Once | ORAL | Status: AC
Start: 2020-10-25 — End: 2020-10-25
  Administered 2020-10-25: 650 mg via ORAL

## 2020-10-25 MED ORDER — LORATADINE 10 MG PO TABS
10.0000 mg | ORAL_TABLET | Freq: Once | ORAL | Status: AC
  Administered 2020-10-25: 10 mg via ORAL

## 2020-10-25 MED ORDER — SODIUM CHLORIDE 0.9 % IV SOLN
200.0000 mg | Freq: Once | INTRAVENOUS | Status: AC
  Administered 2020-10-25: 200 mg via INTRAVENOUS
  Filled 2020-10-25: qty 10

## 2020-10-25 NOTE — Patient Instructions (Signed)

## 2020-11-01 ENCOUNTER — Other Ambulatory Visit: Payer: Self-pay

## 2020-11-01 ENCOUNTER — Inpatient Hospital Stay: Payer: BC Managed Care – PPO

## 2020-11-01 VITALS — BP 108/50 | HR 71 | Temp 97.9°F | Resp 18

## 2020-11-01 DIAGNOSIS — D500 Iron deficiency anemia secondary to blood loss (chronic): Secondary | ICD-10-CM | POA: Diagnosis not present

## 2020-11-01 DIAGNOSIS — E538 Deficiency of other specified B group vitamins: Secondary | ICD-10-CM | POA: Diagnosis not present

## 2020-11-01 DIAGNOSIS — N920 Excessive and frequent menstruation with regular cycle: Secondary | ICD-10-CM | POA: Diagnosis not present

## 2020-11-01 DIAGNOSIS — N809 Endometriosis, unspecified: Secondary | ICD-10-CM | POA: Diagnosis not present

## 2020-11-01 MED ORDER — SODIUM CHLORIDE 0.9 % IV SOLN
200.0000 mg | Freq: Once | INTRAVENOUS | Status: AC
  Administered 2020-11-01: 200 mg via INTRAVENOUS
  Filled 2020-11-01: qty 200

## 2020-11-01 MED ORDER — SODIUM CHLORIDE 0.9 % IV SOLN
Freq: Once | INTRAVENOUS | Status: AC
Start: 2020-11-01 — End: 2020-11-01
  Filled 2020-11-01: qty 250

## 2020-11-01 MED ORDER — ACETAMINOPHEN 325 MG PO TABS
ORAL_TABLET | ORAL | Status: AC
  Filled 2020-11-01: qty 2

## 2020-11-01 MED ORDER — LORATADINE 10 MG PO TABS
10.0000 mg | ORAL_TABLET | Freq: Once | ORAL | Status: AC
  Administered 2020-11-01: 10 mg via ORAL

## 2020-11-01 MED ORDER — ACETAMINOPHEN 325 MG PO TABS
650.0000 mg | ORAL_TABLET | Freq: Once | ORAL | Status: AC
  Administered 2020-11-01: 650 mg via ORAL

## 2020-11-01 MED ORDER — LORATADINE 10 MG PO TABS
ORAL_TABLET | ORAL | Status: AC
  Filled 2020-11-01: qty 1

## 2020-11-01 NOTE — Patient Instructions (Signed)

## 2020-11-03 DIAGNOSIS — N3000 Acute cystitis without hematuria: Secondary | ICD-10-CM | POA: Diagnosis not present

## 2020-11-03 DIAGNOSIS — R300 Dysuria: Secondary | ICD-10-CM | POA: Diagnosis not present

## 2020-12-18 NOTE — Progress Notes (Signed)
HEMATOLOGY/ONCOLOGY CONSULTATION NOTE  Date of Service: 12/18/2020  Patient Care Team: System, Provider Not In as PCP - General  CHIEF COMPLAINTS/PURPOSE OF CONSULTATION:  Anemia  HISTORY OF PRESENTING ILLNESS:   Mary Orozco is a wonderful 25 y.o. female who has been referred to us by Jarrett Sohoourtney Wharton, PA-C for evaluation and management of anemia. Pt is accompanied today by her husband. The pt reports that she is doing well overall.   The pt reports that she went to the hospital in December of 2017 for dysuria and pelvis pain. Pt has a history of intersitial cystitis. During this time she was found to have an oxygen saturation of 85%. Hemolysis and methemoglobinemia were also concerns at this time. The hospitalist were suspcious of G6PD deficiency, which the pt reports was confirmed on multiple labs after discharge. She has been on Macrodantin daily since 2017.  Pt has heavy menstrual cycles and endometriosis. Her menstrual cycles last 4-5 days, with two being extremely heavy. She started [place] four weeks ago. She had heavy nose bleeds as a child which improved as aged. Pt took Floradix for a year with no correction or significant improvement in her blood counts. She has previously used 50K IU Vitamin D weekly, which did not do much to improve her Vitamin D deficiency. She is currently getting her Vitamin D and Vitamin B12 supplementation in the form of gummies.   Pt has been diagnosed with Fibromyalgia and Arthritis. She was seen by Rheumatology who felt there were no concerns for any autoimmune disorders. Pt has diarrhea predominant IBS and had been hospitalized for gastritis in the past. She has been on Omeprazole and Zantac to control her GERD for a few years. She denies having a gluten intolerance. Pt reports rash with Diflucan and Sulfa antibiotics. She denies any anaphylaxis with these medications.   Most recent lab results (07/08/2020) of CBC is as follows: all values are WNL  except for Hgb at 8.8, HCT at 28.6, MCV at 59.4, MCH at 18.3, MCHC at 30.8, RDW at 19.5, PLT at 473K, Baso Rel at 1.1, Total Bilirubin at 0.2. 07/08/2020 TIBC at 578, Iron at 15, Iron Sat at 3, Transferrin at 413 07/08/2020 Vitamin D at 17.6 06/27/2020 Vitamin B12 at 184  On review of systems, pt reports fatigue, imbalance, abdominal pain and denies unexpected weight loss, leg swelling and any other symptoms.   On PMHx the pt reports GERD, IBS, Fibromyalgia, Arthritis, Menorrhagia, Interstitial Cystitis, G6PD deficiency, Endometriosis, IDA, Vitamin D deficiency, Vitamin B12 deficiency. On Family Hx the pt reports no bleeding disorders. Her mother, aunt, and grandmother have thyroid disorders.   INTERVAL HISTORY  Mary Orozco is a wonderful 25 y.o. female who is here today for evaluation and management of anemia. The patient's last visit with us was on 09/19/2020. The pt reports that she is doing well overall.  The pt reports that she has been sick in February with bad food poisoning and flu-like symptoms consequently. The pt has been keeping up with her fluid intake and reports no issues with the IV iron. The pt notes that she has recently been having the feeling of dizziness. The pt notes that she started [place] and has not been having periods. The pt notes that the SL vitamin scratches her throat and I not dissolving.  Lab results today 12/19/2020 of CBC w/diff is as follows: all values are WNL except for Hgb of 11.7, MCV of 75.0, MCH of 22.9, RDW of 24.0. 12/19/2020  Ferritin 36 12/19/2020 Iron of 29, Sat Ratio of 7. 12/19/2020 Vitamin B12 191 12/19/2020 Vitamin D 25 Hydoxy 40   On review of systems, pt reports intermittent dizziness, recent infections and denies changes in bowel habits, abdominal pain, ice cravings, back pain, leg swelling, and any other symptoms.  MEDICAL HISTORY:  Past Medical History:  Diagnosis Date  . Cystitis   . IBS (irritable bowel syndrome)   .  Migraine   . Renal disorder    kidney stones    SURGICAL HISTORY: Past Surgical History:  Procedure Laterality Date  . WISDOM TOOTH EXTRACTION      SOCIAL HISTORY: Social History   Socioeconomic History  . Marital status: Married    Spouse name: Not on file  . Number of children: Not on file  . Years of education: Not on file  . Highest education level: Not on file  Occupational History  . Not on file  Tobacco Use  . Smoking status: Never Smoker  . Smokeless tobacco: Never Used  Substance and Sexual Activity  . Alcohol use: No  . Drug use: No  . Sexual activity: Yes  Other Topics Concern  . Not on file  Social History Narrative  . Not on file   Social Determinants of Health   Financial Resource Strain: Not on file  Food Insecurity: Not on file  Transportation Needs: Not on file  Physical Activity: Not on file  Stress: Not on file  Social Connections: Not on file  Intimate Partner Violence: Not on file    FAMILY HISTORY: Family History  Problem Relation Age of Onset  . Diabetes Other   . CAD Other     ALLERGIES:  is allergic to duloxetine hcl, hydroxyzine hcl, lidocaine, nitrofurantoin, diflucan [fluconazole], sulfa antibiotics, and sulfasalazine.  MEDICATIONS:  Current Outpatient Medications  Medication Sig Dispense Refill  . ALPRAZolam (XANAX) 0.5 MG tablet Take 0.5 mg by mouth 2 (two) times daily as needed for anxiety.    Marland Kitchen b complex vitamins capsule Take 1 capsule by mouth daily. 30 capsule 3  . Cyanocobalamin (B-12) 1000 MCG SUBL Place 2,000 mcg under the tongue daily. 30 tablet 3  . ergocalciferol (VITAMIN D2) 1.25 MG (50000 UT) capsule Take 1 capsule (50,000 Units total) by mouth 2 (two) times a week. 24 capsule 1  . esomeprazole (NEXIUM) 40 MG capsule Take 40 mg by mouth daily at 12 noon.    . nitrofurantoin (MACRODANTIN) 100 MG capsule Take 100 mg by mouth daily.     . ondansetron (ZOFRAN) 4 MG tablet Take 1 tablet (4 mg total) by mouth every 6  (six) hours. 12 tablet 0  . ORILISSA 150 MG TABS Take 1 tablet by mouth at bedtime.    . pregabalin (LYRICA) 150 MG capsule Take 150 mg by mouth 2 (two) times daily.    . sertraline (ZOLOFT) 100 MG tablet 1 tablet     No current facility-administered medications for this visit.    REVIEW OF SYSTEMS:   10 Point review of Systems was done is negative except as noted above.  PHYSICAL EXAMINATION: ECOG PERFORMANCE STATUS: 2 - Symptomatic, <50% confined to bed  . Vitals:   12/19/20 1413  BP: 107/65  Pulse: 92  Resp: 16  Temp: 97.7 F (36.5 C)  SpO2: 93%   Filed Weights   12/19/20 1413  Weight: 259 lb 4.8 oz (117.6 kg)   .Body mass index is 44.51 kg/m.  Exam was given in a wheelchair.  GENERAL:alert, in  no acute distress and comfortable SKIN: no acute rashes, no significant lesions EYES: conjunctiva are pink and non-injected, sclera anicteric OROPHARYNX: MMM, no exudates, no oropharyngeal erythema or ulceration NECK: supple, no JVD LYMPH:  no palpable lymphadenopathy in the cervical, axillary or inguinal regions LUNGS: clear to auscultation b/l with normal respiratory effort HEART: regular rate & rhythm ABDOMEN:  normoactive bowel sounds , non tender, not distended. Extremity: no pedal edema PSYCH: alert & oriented x 3 with fluent speech NEURO: no focal motor/sensory deficits  LABORATORY DATA:  I have reviewed the data as listed  . CBC Latest Ref Rng & Units 12/19/2020 09/19/2020 09/19/2020  WBC 4.0 - 10.5 K/uL 7.9 6.1 -  Hemoglobin 12.0 - 15.0 g/dL 11.7(L) 8.3(L) 8.4(L)  Hematocrit 36.0 - 46.0 % 38.3 30.3(L) -  Platelets 150 - 400 K/uL 334 368 -    . CMP Latest Ref Rng & Units 09/19/2020 08/23/2018 03/27/2017  Glucose 70 - 99 mg/dL 93 89 96  BUN 6 - 20 mg/dL 9 11 7   Creatinine 0.44 - 1.00 mg/dL 4.13 2.44  Sodium 135 - 145 mmol/L 139 137 135  Potassium 3.5 - 5.1 mmol/L 4.1 4.3 4.1  Chloride 98 - 111 mmol/L 105 105 102  CO2 22 - 32 mmol/L 25 24 25   Calcium  8.9 - 10.3 mg/dL 9.6 9.4 9.1  Total Protein 6.5 - 8.1 g/dL 8.0 7.2 -  Total Bilirubin 0.3 - 1.2 mg/dL 0.10) 0.7 -  Alkaline Phos 38 - 126 U/L 94 85 -  AST 15 - 41 U/L 10(L) 28 -  ALT 0 - 44 U/L 10 15 -     RADIOGRAPHIC STUDIES: I have personally reviewed the radiological images as listed and agreed with the findings in the report. No results found.  ASSESSMENT & PLAN:  25 yo with   1) Severe Iron deficiency Anemia 2) B12 deficiency 3) Vit D deficiency  PLAN: -Discussed patient's labwork 12/19/2020; blood counts are improving. Hgb nearly normalized. Other labs pending. -Advised pt that it seems most likely the iron deficiency is due to blood loss from menstrual periods. -Advised pt her Intrinsic Factor and Perietal Cell antibodies were both negative and not suggested as factor for her anemia. -Goal Vitamin B12 >300-400. Advised pt she can switch to liquid Vitamin B12 due to SL not being tolerated well. Advised pt she can swallow the SL Vitamin B12 she currently has. -Advised pt that iron deficiency is a cause for RLS. -Advised pt she may 1-2 more IV iron infusion at this time. Will monitor iron labs today for future infusion needs. -Goal Vitamin D is between 60-90. Continue 50K IU Vitamin D twice weekly. -Continue B-complex. -Recommended pt have labs with PCP within next 2-3 months. -Will see back in 6 months with labs.   FOLLOW UP: Venofer weekly x 2 additional doses RTC with Dr 22 with labs in 6 months   All of the patients questions were answered with apparent satisfaction. The patient knows to call the clinic with any problems, questions or concerns.  The total time spent in the appointment was 20 minutes and more than 50% was on counseling and direct patient cares.     02/18/2021 MD MS AAHIVMS [facility] Hematology/Oncology Physician [facility]  (Office):       [phone] (Work cell):  [phone] (Fax):           [phone]  12/18/2020 2:30  PM  I, [phone], am acting as scribe for Dr.  Wyvonnia Lora, MD.    .I have reviewed the above documentation for accuracy and completeness, and I agree with the above. Johney Maine MD

## 2020-12-19 ENCOUNTER — Inpatient Hospital Stay (HOSPITAL_BASED_OUTPATIENT_CLINIC_OR_DEPARTMENT_OTHER): Payer: 59 | Admitting: Hematology

## 2020-12-19 ENCOUNTER — Inpatient Hospital Stay: Payer: 59 | Attending: Hematology

## 2020-12-19 ENCOUNTER — Other Ambulatory Visit: Payer: Self-pay

## 2020-12-19 VITALS — BP 107/65 | HR 92 | Temp 97.7°F | Resp 16 | Ht 64.0 in | Wt 259.3 lb

## 2020-12-19 DIAGNOSIS — R5383 Other fatigue: Secondary | ICD-10-CM | POA: Insufficient documentation

## 2020-12-19 DIAGNOSIS — E559 Vitamin D deficiency, unspecified: Secondary | ICD-10-CM | POA: Insufficient documentation

## 2020-12-19 DIAGNOSIS — N809 Endometriosis, unspecified: Secondary | ICD-10-CM | POA: Insufficient documentation

## 2020-12-19 DIAGNOSIS — E538 Deficiency of other specified B group vitamins: Secondary | ICD-10-CM | POA: Insufficient documentation

## 2020-12-19 DIAGNOSIS — R109 Unspecified abdominal pain: Secondary | ICD-10-CM | POA: Diagnosis not present

## 2020-12-19 DIAGNOSIS — D500 Iron deficiency anemia secondary to blood loss (chronic): Secondary | ICD-10-CM | POA: Diagnosis not present

## 2020-12-19 DIAGNOSIS — Z79899 Other long term (current) drug therapy: Secondary | ICD-10-CM | POA: Diagnosis not present

## 2020-12-19 DIAGNOSIS — R2689 Other abnormalities of gait and mobility: Secondary | ICD-10-CM | POA: Diagnosis not present

## 2020-12-19 LAB — CBC WITH DIFFERENTIAL/PLATELET
Abs Immature Granulocytes: 0.01 10*3/uL (ref 0.00–0.07)
Basophils Absolute: 0 10*3/uL (ref 0.0–0.1)
Basophils Relative: 1 %
Eosinophils Absolute: 0.1 10*3/uL (ref 0.0–0.5)
Eosinophils Relative: 2 %
HCT: 38.3 % (ref 36.0–46.0)
Hemoglobin: 11.7 g/dL — ABNORMAL LOW (ref 12.0–15.0)
Immature Granulocytes: 0 %
Lymphocytes Relative: 22 %
Lymphs Abs: 1.7 10*3/uL (ref 0.7–4.0)
MCH: 22.9 pg — ABNORMAL LOW (ref 26.0–34.0)
MCHC: 30.5 g/dL (ref 30.0–36.0)
MCV: 75 fL — ABNORMAL LOW (ref 80.0–100.0)
Monocytes Absolute: 0.7 10*3/uL (ref 0.1–1.0)
Monocytes Relative: 8 %
Neutro Abs: 5.3 10*3/uL (ref 1.7–7.7)
Neutrophils Relative %: 67 %
Platelets: 334 10*3/uL (ref 150–400)
RBC: 5.11 MIL/uL (ref 3.87–5.11)
RDW: 24 % — ABNORMAL HIGH (ref 11.5–15.5)
WBC: 7.9 10*3/uL (ref 4.0–10.5)
nRBC: 0 % (ref 0.0–0.2)

## 2020-12-19 LAB — IRON AND TIBC
Iron: 29 ug/dL — ABNORMAL LOW (ref 41–142)
Saturation Ratios: 7 % — ABNORMAL LOW (ref 21–57)
TIBC: 425 ug/dL (ref 236–444)
UIBC: 396 ug/dL — ABNORMAL HIGH (ref 120–384)

## 2020-12-19 LAB — VITAMIN B12: Vitamin B-12: 191 pg/mL (ref 180–914)

## 2020-12-19 LAB — VITAMIN D 25 HYDROXY (VIT D DEFICIENCY, FRACTURES): Vit D, 25-Hydroxy: 40.76 ng/mL (ref 30–100)

## 2020-12-19 LAB — FERRITIN: Ferritin: 36 ng/mL (ref 11–307)

## 2020-12-24 ENCOUNTER — Telehealth: Payer: Self-pay | Admitting: Hematology

## 2020-12-24 NOTE — Telephone Encounter (Signed)
Scheduled appointment to next available dates in infusion per 3/10 los. Attempted to call patient multiple times over several days. No answer and no call back when I left a vm. Mailed updated calendar to patient.

## 2020-12-30 ENCOUNTER — Emergency Department (HOSPITAL_COMMUNITY): Payer: 59

## 2020-12-30 ENCOUNTER — Encounter (HOSPITAL_COMMUNITY): Payer: Self-pay

## 2020-12-30 ENCOUNTER — Other Ambulatory Visit: Payer: Self-pay

## 2020-12-30 DIAGNOSIS — W010XXA Fall on same level from slipping, tripping and stumbling without subsequent striking against object, initial encounter: Secondary | ICD-10-CM | POA: Diagnosis not present

## 2020-12-30 DIAGNOSIS — M7989 Other specified soft tissue disorders: Secondary | ICD-10-CM | POA: Diagnosis not present

## 2020-12-30 DIAGNOSIS — S92322A Displaced fracture of second metatarsal bone, left foot, initial encounter for closed fracture: Secondary | ICD-10-CM | POA: Diagnosis not present

## 2020-12-30 DIAGNOSIS — Z79899 Other long term (current) drug therapy: Secondary | ICD-10-CM | POA: Diagnosis not present

## 2020-12-30 DIAGNOSIS — M79672 Pain in left foot: Secondary | ICD-10-CM | POA: Diagnosis not present

## 2020-12-30 NOTE — ED Triage Notes (Signed)
Pt reports falling while getting into car this afternoon injuring left foot. Good pulses. Swelling.

## 2020-12-31 ENCOUNTER — Emergency Department (HOSPITAL_COMMUNITY)
Admission: EM | Admit: 2020-12-31 | Discharge: 2020-12-31 | Disposition: A | Discharge status: Home / Self Care | Payer: 59 | Attending: Emergency Medicine | Admitting: Emergency Medicine

## 2020-12-31 DIAGNOSIS — S92322A Displaced fracture of second metatarsal bone, left foot, initial encounter for closed fracture: Secondary | ICD-10-CM

## 2020-12-31 MED ORDER — OXYCODONE-ACETAMINOPHEN 5-325 MG PO TABS
1.0000 | ORAL_TABLET | Freq: Once | ORAL | Status: AC
Start: 2020-12-31 — End: 2020-12-31
  Administered 2020-12-31: 1 via ORAL
  Filled 2020-12-31: qty 1

## 2020-12-31 MED ORDER — OXYCODONE-ACETAMINOPHEN 5-325 MG PO TABS: 1.0000 | ORAL_TABLET | ORAL | 0 refills | Status: DC | PRN

## 2020-12-31 NOTE — ED Provider Notes (Signed)
[facility]-EMERGENCY DEPT Provider Note   CSN: [id number] Arrival date & time: 12/30/20  2004     History Chief Complaint  Patient presents with  . Foot Pain    Mary Orozco is a 25 y.o. female.  The history is provided by the patient and medical records.  Foot Pain    25 year old female with history of IBS, migraine, fibromyalgia, rheumatoid arthritis, presenting to the ED with left foot pain.  Patient states she was going to step up on a curb, missed stepped and left foot plantar flexed beneath her body and she fell to the ground.  There was no head injury or loss of consciousness.  States she has not been able to bear weight on her foot since this occurred.  She has a lot of pain, swelling, and bruising to dorsal left foot.  Denies any focal numbness but states toes feel "tingly".  Denies any prior left foot injuries or surgeries in the past.  Does report some baseline gait trouble related to her fibromyalgia, uses cane for assistance when needed.  No meds taken prior to arrival.  Past Medical History:  Diagnosis Date  . Cystitis   . IBS (irritable bowel syndrome)   . Migraine   . Renal disorder    kidney stones    Patient Active Problem List   Diagnosis Date Noted  . Iron deficiency anemia due to chronic blood loss 09/19/2020  . Hypoxia   . Acute respiratory failure with hypoxia (HCC) 09/12/2016  . Anemia 09/12/2016  . Splenomegaly 09/12/2016  . Cystitis 09/12/2016    Past Surgical History:  Procedure Laterality Date  . WISDOM TOOTH EXTRACTION       OB History   No obstetric history on file.     Family History  Problem Relation Age of Onset  . Diabetes Other   . CAD Other     Social History   Tobacco Use  . Smoking status: Never Smoker  . Smokeless tobacco: Never Used  Substance Use Topics  . Alcohol use: No  . Drug use: No    Home Medications Prior to Admission medications   Medication Sig Start Date End Date Taking?  Authorizing Provider  ALPRAZolam Prudy Feeler) 0.5 MG tablet Take 0.5 mg by mouth 2 (two) times daily as needed for anxiety.    [provider]  b complex vitamins capsule Take 1 capsule by mouth daily. 09/19/20   Johney Maine, MD  Cyanocobalamin (B-12) 1000 MCG SUBL Place 2,000 mcg under the tongue daily. 09/19/20   Johney Maine, MD  ergocalciferol (VITAMIN D2) 1.25 MG (50000 UT) capsule Take 1 capsule (50,000 Units total) by mouth 2 (two) times a week. 09/19/20   Johney Maine, MD  esomeprazole (NEXIUM) 40 MG capsule Take 40 mg by mouth daily at 12 noon.    [provider]  nitrofurantoin (MACRODANTIN) 100 MG capsule Take 100 mg by mouth daily.     [provider]  ondansetron (ZOFRAN) 4 MG tablet Take 1 tablet (4 mg total) by mouth every 6 (six) hours. 01/12/15   Raeford Razor, MD  ORILISSA 150 MG TABS Take 1 tablet by mouth at bedtime. 09/21/20   [provider]  pregabalin (LYRICA) 150 MG capsule Take 150 mg by mouth 2 (two) times daily.    [provider]  sertraline (ZOLOFT) 100 MG tablet 1 tablet    [provider]    Allergies    Duloxetine hcl, Hydroxyzine hcl,  Lidocaine, Nitrofurantoin, Other, Diflucan [fluconazole], Sulfa antibiotics, and Sulfasalazine  Review of Systems   Review of Systems  Musculoskeletal: Positive for arthralgias.  All other systems reviewed and are negative.   Physical Exam Updated Vital Signs BP 134/89   Pulse 95   Temp 99 F (37.2 C) (Oral)   Resp 18   Ht 5\' 4"  (1.626 m)   Wt 113.4 kg   SpO2 98%   BMI 42.91 kg/m   Physical Exam Vitals and nursing note reviewed.  Constitutional:      Appearance: She is well-developed.  HENT:     Head: Normocephalic and atraumatic.  Eyes:     Conjunctiva/sclera: Conjunctivae normal.     Pupils: Pupils are equal, round, and reactive to light.  Cardiovascular:     Rate and Rhythm: Normal rate and regular rhythm.     Heart sounds: Normal  heart sounds.  Pulmonary:     Effort: Pulmonary effort is normal.     Breath sounds: Normal breath sounds.  Abdominal:     General: Bowel sounds are normal.     Palpations: Abdomen is soft.  Musculoskeletal:        General: Normal range of motion.     Cervical back: Normal range of motion.     Comments: Left foot is swollen and bruised along dorsal aspect of foot from about 2nd metatarsal and extending laterally; no bruising noted about medial edge of foot or first digit; DP pulse intact, moving toes but causing pain so limited, good cap refill and distal sensation  Skin:    General: Skin is warm and dry.  Neurological:     Mental Status: She is alert and oriented to person, place, and time.     ED Results / Procedures / Treatments   Labs (all labs ordered are listed, but only abnormal results are displayed) Labs Reviewed - No data to display  EKG None  Radiology DG Foot Complete Left  Result Date: 12/30/2020 CLINICAL DATA:  Fall, left foot swelling EXAM: LEFT FOOT - COMPLETE 3+ VIEW COMPARISON:  None. FINDINGS: Soft tissue swelling along the dorsum of the foot overlying the metatarsals. Fracture noted at the base of the left 2nd metatarsal and possibly 1st metatarsal. No subluxation or dislocation. IMPRESSION: Fracture at the base of the left 2nd metatarsal and possibly 1st metatarsal. Electronically Signed   By: 01/01/2021 M.D.   On: 12/30/2020 20:33    Procedures Procedures   Medications Ordered in ED Medications  oxyCODONE-acetaminophen (PERCOCET/ROXICET) 5-325 MG per tablet 1 tablet (1 tablet Oral Given 12/31/20 0051)    ED Course  I have reviewed the triage vital signs and the nursing notes.  Pertinent labs & imaging results that were available during my care of the patient were reviewed by me and considered in my medical decision making (see chart for details).    MDM Rules/Calculators/A&P  25 y.o. F here with left foot pain after she mis stepped on a curb and  fell. No head injury or LOC.  Reports pain along dorsal left foot.  She does have obvious swelling/bruising noted along dorsal foot from about 2nd metatarsal and extending laterally.  Foot remains NVI.  X-ray with 2nd metatarsal fracture and questionably first metatarsal.  She actually does not have any tenderness along the first metatarsal or great toe whatsoever.  Attempted to place her in cam walker, she remove this after about 10 minutes stating it was too uncomfortable.  She was then placed in a postop  shoe which she seemed to tolerate better, however she was advised this does not offer same support/protection as CAM walker.  She was discharged with cam walker, can try to wear this once some of the swelling goes down.  Ice and elevate at home.  Orthopedic follow-up given.  Encouraged to return here for any new or acute changes.  Final Clinical Impression(s) / ED Diagnoses Final diagnoses:  Closed displaced fracture of second metatarsal bone of left foot, initial encounter    Rx / DC Orders ED Discharge Orders         Ordered           12/31/20 0203    oxyCODONE-acetaminophen (PERCOCET) 5-325 MG tablet  Every 4 hours PRN        12/31/20 0206           Garlon Hatchet, PA-C 12/31/20 0343    Nira Conn, MD 01/01/21 [phone]

## 2020-12-31 NOTE — Discharge Instructions (Signed)
Take the prescribed medication as directed.  Do not drive while taking this.  Can ice and elevate at home to keep swelling down. Follow-up with orthopedics to ensure proper healing of foot. Return to the ED for new or worsening symptoms.

## 2021-01-03 DIAGNOSIS — M79672 Pain in left foot: Secondary | ICD-10-CM | POA: Diagnosis not present

## 2021-01-03 DIAGNOSIS — S92902A Unspecified fracture of left foot, initial encounter for closed fracture: Secondary | ICD-10-CM | POA: Diagnosis not present

## 2021-01-06 DIAGNOSIS — S92902A Unspecified fracture of left foot, initial encounter for closed fracture: Secondary | ICD-10-CM | POA: Diagnosis not present

## 2021-01-06 DIAGNOSIS — S92322A Displaced fracture of second metatarsal bone, left foot, initial encounter for closed fracture: Secondary | ICD-10-CM | POA: Diagnosis not present

## 2021-01-08 ENCOUNTER — Other Ambulatory Visit (HOSPITAL_COMMUNITY): Payer: Self-pay | Admitting: Orthopedic Surgery

## 2021-01-09 ENCOUNTER — Other Ambulatory Visit: Payer: Self-pay

## 2021-01-09 ENCOUNTER — Inpatient Hospital Stay: Payer: 59

## 2021-01-09 VITALS — BP 141/77 | HR 95 | Temp 98.1°F | Resp 16

## 2021-01-09 DIAGNOSIS — D500 Iron deficiency anemia secondary to blood loss (chronic): Secondary | ICD-10-CM | POA: Diagnosis not present

## 2021-01-09 DIAGNOSIS — Z79899 Other long term (current) drug therapy: Secondary | ICD-10-CM | POA: Diagnosis not present

## 2021-01-09 DIAGNOSIS — R109 Unspecified abdominal pain: Secondary | ICD-10-CM | POA: Diagnosis not present

## 2021-01-09 DIAGNOSIS — E538 Deficiency of other specified B group vitamins: Secondary | ICD-10-CM | POA: Diagnosis not present

## 2021-01-09 DIAGNOSIS — R2689 Other abnormalities of gait and mobility: Secondary | ICD-10-CM | POA: Diagnosis not present

## 2021-01-09 DIAGNOSIS — N809 Endometriosis, unspecified: Secondary | ICD-10-CM | POA: Diagnosis not present

## 2021-01-09 DIAGNOSIS — R5383 Other fatigue: Secondary | ICD-10-CM | POA: Diagnosis not present

## 2021-01-09 DIAGNOSIS — E559 Vitamin D deficiency, unspecified: Secondary | ICD-10-CM | POA: Diagnosis not present

## 2021-01-09 MED ORDER — SODIUM CHLORIDE 0.9 % IV SOLN
300.0000 mg | Freq: Once | INTRAVENOUS | Status: AC
  Administered 2021-01-09: 300 mg via INTRAVENOUS
  Filled 2021-01-09: qty 10

## 2021-01-09 MED ORDER — LORATADINE 10 MG PO TABS
ORAL_TABLET | ORAL | Status: AC
  Filled 2021-01-09: qty 1

## 2021-01-09 MED ORDER — ACETAMINOPHEN 325 MG PO TABS
650.0000 mg | ORAL_TABLET | Freq: Once | ORAL | Status: AC
  Administered 2021-01-09: 650 mg via ORAL

## 2021-01-09 MED ORDER — LORATADINE 10 MG PO TABS
10.0000 mg | ORAL_TABLET | Freq: Once | ORAL | Status: AC
  Administered 2021-01-09: 10 mg via ORAL

## 2021-01-09 MED ORDER — ACETAMINOPHEN 325 MG PO TABS
ORAL_TABLET | ORAL | Status: AC
  Filled 2021-01-09: qty 2

## 2021-01-09 MED ORDER — SODIUM CHLORIDE 0.9 % IV SOLN
Freq: Once | INTRAVENOUS | Status: AC
  Filled 2021-01-09: qty 250

## 2021-01-13 ENCOUNTER — Other Ambulatory Visit: Payer: Self-pay

## 2021-01-13 ENCOUNTER — Other Ambulatory Visit (HOSPITAL_COMMUNITY)
Admission: RE | Admit: 2021-01-13 | Discharge: 2021-01-13 | Disposition: A | Discharge status: Home / Self Care | Payer: 59 | Source: Ambulatory Visit | Attending: Orthopedic Surgery | Admitting: Orthopedic Surgery

## 2021-01-13 ENCOUNTER — Encounter (HOSPITAL_BASED_OUTPATIENT_CLINIC_OR_DEPARTMENT_OTHER): Payer: Self-pay | Admitting: Orthopedic Surgery

## 2021-01-13 DIAGNOSIS — Z20822 Contact with and (suspected) exposure to covid-19: Secondary | ICD-10-CM | POA: Insufficient documentation

## 2021-01-13 DIAGNOSIS — X501XXA Overexertion from prolonged static or awkward postures, initial encounter: Secondary | ICD-10-CM | POA: Diagnosis not present

## 2021-01-13 DIAGNOSIS — Z01812 Encounter for preprocedural laboratory examination: Secondary | ICD-10-CM | POA: Insufficient documentation

## 2021-01-13 DIAGNOSIS — Z882 Allergy status to sulfonamides status: Secondary | ICD-10-CM | POA: Diagnosis not present

## 2021-01-13 DIAGNOSIS — S93325A Dislocation of tarsometatarsal joint of left foot, initial encounter: Secondary | ICD-10-CM | POA: Diagnosis not present

## 2021-01-13 DIAGNOSIS — Z883 Allergy status to other anti-infective agents status: Secondary | ICD-10-CM | POA: Diagnosis not present

## 2021-01-13 DIAGNOSIS — M25375 Other instability, left foot: Secondary | ICD-10-CM | POA: Diagnosis not present

## 2021-01-13 DIAGNOSIS — Z79899 Other long term (current) drug therapy: Secondary | ICD-10-CM | POA: Diagnosis not present

## 2021-01-13 DIAGNOSIS — S92325A Nondisplaced fracture of second metatarsal bone, left foot, initial encounter for closed fracture: Secondary | ICD-10-CM | POA: Diagnosis not present

## 2021-01-13 DIAGNOSIS — S92322A Displaced fracture of second metatarsal bone, left foot, initial encounter for closed fracture: Secondary | ICD-10-CM | POA: Diagnosis not present

## 2021-01-13 DIAGNOSIS — Z888 Allergy status to other drugs, medicaments and biological substances status: Secondary | ICD-10-CM | POA: Diagnosis not present

## 2021-01-13 LAB — SARS CORONAVIRUS 2 (TAT 6-24 HRS): SARS Coronavirus 2: NEGATIVE

## 2021-01-15 ENCOUNTER — Telehealth: Payer: Self-pay | Admitting: Hematology

## 2021-01-15 NOTE — Telephone Encounter (Signed)
R/s appt per 3/31 sch msg. Called pt, no answer. Left msg with appt date and time.

## 2021-01-15 NOTE — Progress Notes (Signed)

## 2021-01-16 ENCOUNTER — Other Ambulatory Visit: Payer: Self-pay

## 2021-01-16 ENCOUNTER — Inpatient Hospital Stay: Payer: 59

## 2021-01-16 ENCOUNTER — Encounter (HOSPITAL_BASED_OUTPATIENT_CLINIC_OR_DEPARTMENT_OTHER)
Admission: RE | Disposition: A | Discharge status: Home / Self Care | Payer: Self-pay | Source: Home / Self Care | Attending: Orthopedic Surgery

## 2021-01-16 ENCOUNTER — Ambulatory Visit (HOSPITAL_BASED_OUTPATIENT_CLINIC_OR_DEPARTMENT_OTHER)
Admission: RE | Admit: 2021-01-16 | Discharge: 2021-01-16 | Disposition: A | Discharge status: Home / Self Care | Payer: 59 | Attending: Orthopedic Surgery | Admitting: Orthopedic Surgery

## 2021-01-16 ENCOUNTER — Ambulatory Visit (HOSPITAL_BASED_OUTPATIENT_CLINIC_OR_DEPARTMENT_OTHER): Payer: 59 | Admitting: Anesthesiology

## 2021-01-16 ENCOUNTER — Encounter (HOSPITAL_BASED_OUTPATIENT_CLINIC_OR_DEPARTMENT_OTHER): Payer: Self-pay | Admitting: Orthopedic Surgery

## 2021-01-16 DIAGNOSIS — Z79899 Other long term (current) drug therapy: Secondary | ICD-10-CM | POA: Diagnosis not present

## 2021-01-16 DIAGNOSIS — Z883 Allergy status to other anti-infective agents status: Secondary | ICD-10-CM | POA: Diagnosis not present

## 2021-01-16 DIAGNOSIS — X501XXA Overexertion from prolonged static or awkward postures, initial encounter: Secondary | ICD-10-CM | POA: Insufficient documentation

## 2021-01-16 DIAGNOSIS — S93315A Dislocation of tarsal joint of left foot, initial encounter: Secondary | ICD-10-CM | POA: Diagnosis not present

## 2021-01-16 DIAGNOSIS — Z882 Allergy status to sulfonamides status: Secondary | ICD-10-CM | POA: Insufficient documentation

## 2021-01-16 DIAGNOSIS — D649 Anemia, unspecified: Secondary | ICD-10-CM | POA: Diagnosis not present

## 2021-01-16 DIAGNOSIS — M25375 Other instability, left foot: Secondary | ICD-10-CM | POA: Insufficient documentation

## 2021-01-16 DIAGNOSIS — Z20822 Contact with and (suspected) exposure to covid-19: Secondary | ICD-10-CM | POA: Diagnosis not present

## 2021-01-16 DIAGNOSIS — S92322A Displaced fracture of second metatarsal bone, left foot, initial encounter for closed fracture: Secondary | ICD-10-CM | POA: Insufficient documentation

## 2021-01-16 DIAGNOSIS — S92325A Nondisplaced fracture of second metatarsal bone, left foot, initial encounter for closed fracture: Secondary | ICD-10-CM | POA: Diagnosis not present

## 2021-01-16 DIAGNOSIS — Z888 Allergy status to other drugs, medicaments and biological substances status: Secondary | ICD-10-CM | POA: Diagnosis not present

## 2021-01-16 DIAGNOSIS — G8918 Other acute postprocedural pain: Secondary | ICD-10-CM | POA: Diagnosis not present

## 2021-01-16 DIAGNOSIS — S93325A Dislocation of tarsometatarsal joint of left foot, initial encounter: Secondary | ICD-10-CM | POA: Insufficient documentation

## 2021-01-16 DIAGNOSIS — K219 Gastro-esophageal reflux disease without esophagitis: Secondary | ICD-10-CM | POA: Diagnosis not present

## 2021-01-16 HISTORY — PX: OPEN REDUCTION INTERNAL FIXATION (ORIF) FOOT LISFRANC FRACTURE: SHX5990

## 2021-01-16 HISTORY — DX: Other specified postprocedural states: Z98.890

## 2021-01-16 HISTORY — DX: Nausea with vomiting, unspecified: R11.2

## 2021-01-16 LAB — POCT PREGNANCY, URINE: Preg Test, Ur: NEGATIVE

## 2021-01-16 SURGERY — OPEN REDUCTION INTERNAL FIXATION (ORIF) FOOT LISFRANC FRACTURE
Anesthesia: Regional | Site: Foot | Laterality: Left

## 2021-01-16 MED ORDER — SCOPOLAMINE 1 MG/3DAYS TD PT72
1.0000 | MEDICATED_PATCH | TRANSDERMAL | Status: DC
  Administered 2021-01-16: 1.5 mg via TRANSDERMAL

## 2021-01-16 MED ORDER — ACETAMINOPHEN 500 MG PO TABS
ORAL_TABLET | ORAL | Status: AC
  Filled 2021-01-16: qty 2

## 2021-01-16 MED ORDER — PROPOFOL 10 MG/ML IV BOLUS
INTRAVENOUS | Status: DC | PRN
  Administered 2021-01-16: 200 mg via INTRAVENOUS

## 2021-01-16 MED ORDER — MEPERIDINE HCL 25 MG/ML IJ SOLN: 6.2500 mg | INTRAMUSCULAR | Status: DC | PRN

## 2021-01-16 MED ORDER — HYDROMORPHONE HCL 1 MG/ML IJ SOLN: 0.2500 mg | INTRAMUSCULAR | Status: DC | PRN

## 2021-01-16 MED ORDER — MIDAZOLAM HCL 2 MG/2ML IJ SOLN
INTRAMUSCULAR | Status: AC
  Filled 2021-01-16: qty 2

## 2021-01-16 MED ORDER — OXYCODONE HCL 5 MG PO TABS
5.0000 mg | ORAL_TABLET | Freq: Once | ORAL | Status: DC | PRN
Start: 2021-01-16 — End: 2021-01-16

## 2021-01-16 MED ORDER — FENTANYL CITRATE (PF) 100 MCG/2ML IJ SOLN
100.0000 ug | Freq: Once | INTRAMUSCULAR | Status: AC
  Administered 2021-01-16: 100 ug via INTRAVENOUS

## 2021-01-16 MED ORDER — CEFAZOLIN SODIUM-DEXTROSE 2-4 GM/100ML-% IV SOLN
2.0000 g | INTRAVENOUS | Status: AC
  Administered 2021-01-16: 2 g via INTRAVENOUS

## 2021-01-16 MED ORDER — PROMETHAZINE HCL 25 MG/ML IJ SOLN: 6.2500 mg | INTRAMUSCULAR | Status: DC | PRN

## 2021-01-16 MED ORDER — ROPIVACAINE HCL 5 MG/ML IJ SOLN
INTRAMUSCULAR | Status: DC | PRN
  Administered 2021-01-16: 40 mL via EPIDURAL

## 2021-01-16 MED ORDER — FENTANYL CITRATE (PF) 100 MCG/2ML IJ SOLN
INTRAMUSCULAR | Status: AC
  Filled 2021-01-16: qty 2

## 2021-01-16 MED ORDER — CEFAZOLIN SODIUM-DEXTROSE 2-4 GM/100ML-% IV SOLN
INTRAVENOUS | Status: AC
  Filled 2021-01-16: qty 100

## 2021-01-16 MED ORDER — OXYCODONE HCL 5 MG/5ML PO SOLN: 5.0000 mg | Freq: Once | ORAL | Status: DC | PRN

## 2021-01-16 MED ORDER — EPHEDRINE SULFATE 50 MG/ML IJ SOLN
INTRAMUSCULAR | Status: DC | PRN
  Administered 2021-01-16: 5 mg via INTRAVENOUS

## 2021-01-16 MED ORDER — DEXAMETHASONE SODIUM PHOSPHATE 10 MG/ML IJ SOLN
INTRAMUSCULAR | Status: DC | PRN
  Administered 2021-01-16: 10 mg via INTRAVENOUS

## 2021-01-16 MED ORDER — LACTATED RINGERS IV SOLN: INTRAVENOUS | Status: DC

## 2021-01-16 MED ORDER — SODIUM CHLORIDE 0.9 % IV SOLN: INTRAVENOUS | Status: DC

## 2021-01-16 MED ORDER — SCOPOLAMINE 1 MG/3DAYS TD PT72
MEDICATED_PATCH | TRANSDERMAL | Status: AC
  Filled 2021-01-16: qty 1

## 2021-01-16 MED ORDER — MIDAZOLAM HCL 2 MG/2ML IJ SOLN
4.0000 mg | Freq: Once | INTRAMUSCULAR | Status: AC
  Administered 2021-01-16: 4 mg via INTRAVENOUS

## 2021-01-16 MED ORDER — ACETAMINOPHEN 500 MG PO TABS
1000.0000 mg | ORAL_TABLET | Freq: Once | ORAL | Status: AC
  Administered 2021-01-16: 1000 mg via ORAL

## 2021-01-16 MED ORDER — OXYCODONE HCL 5 MG PO TABS: 5.0000 mg | ORAL_TABLET | Freq: Four times a day (QID) | ORAL | 0 refills | Status: AC | PRN

## 2021-01-16 MED ORDER — RIVAROXABAN 10 MG PO TABS: 10.0000 mg | ORAL_TABLET | Freq: Every day | ORAL | 0 refills | Status: AC

## 2021-01-16 SURGICAL SUPPLY — 68 items
APL PRP STRL LF DISP 70% ISPRP (MISCELLANEOUS) ×1
BANDAGE ESMARK 6X9 LF (GAUZE/BANDAGES/DRESSINGS) ×1 IMPLANT
BIT DRILL 2.9 CANN QC NONSTRL (BIT) ×1 IMPLANT
BLADE MICRO SAGITTAL (BLADE) IMPLANT
BLADE SURG 15 STRL LF DISP TIS (BLADE) ×2 IMPLANT
BLADE SURG 15 STRL SS (BLADE) ×4
BNDG CMPR 9X6 STRL LF SNTH (GAUZE/BANDAGES/DRESSINGS) ×1
BNDG COHESIVE 4X5 TAN STRL (GAUZE/BANDAGES/DRESSINGS) ×2 IMPLANT
BNDG COHESIVE 6X5 TAN STRL LF (GAUZE/BANDAGES/DRESSINGS) ×2 IMPLANT
BNDG ESMARK 6X9 LF (GAUZE/BANDAGES/DRESSINGS) ×2
CAP PIN PROTECTOR ORTHO WHT (CAP) IMPLANT
CHLORAPREP W/TINT 26 (MISCELLANEOUS) ×2 IMPLANT
COVER BACK TABLE 60X90IN (DRAPES) ×2 IMPLANT
COVER WAND RF STERILE (DRAPES) IMPLANT
CUFF TOURN SGL QUICK 34 (TOURNIQUET CUFF)
CUFF TRNQT CYL 34X4.125X (TOURNIQUET CUFF) IMPLANT
DECANTER SPIKE VIAL GLASS SM (MISCELLANEOUS) IMPLANT
DRAPE EXTREMITY T 121X128X90 (DISPOSABLE) ×2 IMPLANT
DRAPE OEC MINIVIEW 54X84 (DRAPES) ×2 IMPLANT
DRAPE U-SHAPE 47X51 STRL (DRAPES) ×2 IMPLANT
DRSG MEPITEL 4X7.2 (GAUZE/BANDAGES/DRESSINGS) ×2 IMPLANT
DRSG PAD ABDOMINAL 8X10 ST (GAUZE/BANDAGES/DRESSINGS) ×2 IMPLANT
ELECT REM PT RETURN 9FT ADLT (ELECTROSURGICAL) ×2
ELECTRODE REM PT RTRN 9FT ADLT (ELECTROSURGICAL) ×1 IMPLANT
GAUZE SPONGE 4X4 12PLY STRL (GAUZE/BANDAGES/DRESSINGS) ×2 IMPLANT
GLOVE SRG 8 PF TXTR STRL LF DI (GLOVE) ×2 IMPLANT
GLOVE SURG ENC MOIS LTX SZ8 (GLOVE) ×2 IMPLANT
GLOVE SURG LTX SZ8 (GLOVE) ×2 IMPLANT
GLOVE SURG UNDER POLY LF SZ8 (GLOVE) ×4
GOWN STRL REUS W/ TWL LRG LVL3 (GOWN DISPOSABLE) ×1 IMPLANT
GOWN STRL REUS W/ TWL XL LVL3 (GOWN DISPOSABLE) ×2 IMPLANT
GOWN STRL REUS W/TWL LRG LVL3 (GOWN DISPOSABLE) ×2
GOWN STRL REUS W/TWL XL LVL3 (GOWN DISPOSABLE) ×4
K-WIRE .054X4 (WIRE) IMPLANT
K-WIRE ACE 1.6X6 (WIRE) ×4
KWIRE ACE 1.6X6 (WIRE) IMPLANT
NDL HYPO 25X1 1.5 SAFETY (NEEDLE) IMPLANT
NEEDLE HYPO 22GX1.5 SAFETY (NEEDLE) IMPLANT
NEEDLE HYPO 25X1 1.5 SAFETY (NEEDLE) IMPLANT
PACK BASIN DAY SURGERY FS (CUSTOM PROCEDURE TRAY) ×2 IMPLANT
PAD CAST 4YDX4 CTTN HI CHSV (CAST SUPPLIES) ×1 IMPLANT
PADDING CAST ABS 4INX4YD NS (CAST SUPPLIES)
PADDING CAST ABS COTTON 4X4 ST (CAST SUPPLIES) IMPLANT
PADDING CAST COTTON 4X4 STRL (CAST SUPPLIES) ×2
PADDING CAST COTTON 6X4 STRL (CAST SUPPLIES) ×2 IMPLANT
PENCIL SMOKE EVACUATOR (MISCELLANEOUS) ×2 IMPLANT
SANITIZER HAND PURELL 535ML FO (MISCELLANEOUS) ×2 IMPLANT
SCREW NLOCK CANC HEX 4X26 (Screw) ×1 IMPLANT
SCREW NLOCK CANC HEX 4X32 (Screw) ×1 IMPLANT
SHEET MEDIUM DRAPE 40X70 STRL (DRAPES) ×2 IMPLANT
SLEEVE SCD COMPRESS KNEE MED (STOCKING) ×2 IMPLANT
SPLINT FAST PLASTER 5X30 (CAST SUPPLIES) ×20
SPLINT PLASTER CAST FAST 5X30 (CAST SUPPLIES) ×20 IMPLANT
SPONGE LAP 18X18 RF (DISPOSABLE) ×2 IMPLANT
STOCKINETTE 6  STRL (DRAPES) ×2
STOCKINETTE 6 STRL (DRAPES) ×1 IMPLANT
SUCTION FRAZIER HANDLE 10FR (MISCELLANEOUS) ×2
SUCTION TUBE FRAZIER 10FR DISP (MISCELLANEOUS) ×1 IMPLANT
SUT ETHILON 3 0 PS 1 (SUTURE) ×2 IMPLANT
SUT MNCRL AB 3-0 PS2 18 (SUTURE) ×2 IMPLANT
SUT VIC AB 0 SH 27 (SUTURE) IMPLANT
SUT VIC AB 2-0 SH 27 (SUTURE)
SUT VIC AB 2-0 SH 27XBRD (SUTURE) IMPLANT
SYR BULB EAR ULCER 3OZ GRN STR (SYRINGE) ×2 IMPLANT
SYR CONTROL 10ML LL (SYRINGE) IMPLANT
TOWEL GREEN STERILE FF (TOWEL DISPOSABLE) ×2 IMPLANT
TUBE CONNECTING 20X1/4 (TUBING) ×2 IMPLANT
UNDERPAD 30X36 HEAVY ABSORB (UNDERPADS AND DIAPERS) ×2 IMPLANT

## 2021-01-16 NOTE — Anesthesia Procedure Notes (Signed)
Anesthesia Regional Block: Adductor canal block   Pre-Anesthetic Checklist: ,, timeout performed, Correct Patient, Correct Site, Correct Laterality, Correct Procedure, Correct Position, site marked, Risks and benefits discussed,  Surgical consent,  Pre-op evaluation,  At surgeon's request and post-op pain management  Laterality: Left  Prep: Maximum Sterile Barrier Precautions used, chloraprep       Needles:  Injection technique: Single-shot  Needle Type: Echogenic Stimulator Needle     Needle Length: 9cm  Needle Gauge: 22     Additional Needles:   Procedures:,,,, ultrasound used (permanent image in chart),,,,  Narrative:  Start time: 01/16/2021 12:55 PM End time: 01/16/2021 1:00 PM Injection made incrementally with aspirations every 5 mL.  Performed by: Personally  Anesthesiologist: Lannie Fields, DO  Additional Notes: Monitors applied. No increased pain on injection. No increased resistance to injection. Injection made in 5cc increments. Good needle visualization. Patient tolerated procedure well.

## 2021-01-16 NOTE — Anesthesia Procedure Notes (Signed)
Anesthesia Regional Block: Popliteal block   Pre-Anesthetic Checklist: ,, timeout performed, Correct Patient, Correct Site, Correct Laterality, Correct Procedure, Correct Position, site marked, Risks and benefits discussed,  Surgical consent,  Pre-op evaluation,  At surgeon's request and post-op pain management  Laterality: Left  Prep: Maximum Sterile Barrier Precautions used, chloraprep       Needles:  Injection technique: Single-shot  Needle Type: Echogenic Stimulator Needle     Needle Length: 9cm  Needle Gauge: 22     Additional Needles:   Procedures:,,,, ultrasound used (permanent image in chart),,,,  Narrative:  Start time: 01/16/2021 12:50 PM End time: 01/16/2021 12:55 PM Injection made incrementally with aspirations every 5 mL.  Performed by: Personally  Anesthesiologist: Lannie Fields, DO  Additional Notes: Monitors applied. No increased pain on injection. No increased resistance to injection. Injection made in 5cc increments. Good needle visualization. Patient tolerated procedure well.

## 2021-01-16 NOTE — Discharge Instructions (Addendum)
Mary Arthurs, MD [facility]  Please read the following information regarding your care after surgery.  Medications  You only need a prescription for the narcotic pain medicine (ex. oxycodone, Percocet, Norco).  All of the other medicines listed below are available over the counter. X Aleve 2 pills twice a day for the first 3 days after surgery. X acetominophen (Tylenol) 650 mg every 4-6 hours as you need for minor to moderate pain X oxycodone as prescribed for severe pain  Narcotic pain medicine (ex. oxycodone, Percocet, Vicodin) will cause constipation.  To prevent this problem, take the following medicines while you are taking any pain medicine. X docusate sodium (Colace) 100 mg twice a day X senna (Senokot) 2 tablets twice a day  X To help prevent blood clots, take Xarelto every day for two weeks after surgery.  You should also get up every hour while you are awake to move around.  Start aspirin daily when you've finished the Xarelto.  Weight Bearing ? Bear weight when you are able on your operated leg or foot. ? Bear weight only on your operated foot in the post-op shoe. X Do not bear any weight on the operated leg or foot.  Cast / Splint / Dressing X Keep your splint, cast or dressing clean and dry.  Don't put anything (coat hanger, pencil, etc) down inside of it.  If it gets damp, use a hair dryer on the cool setting to dry it.  If it gets soaked, call the office to schedule an appointment for a cast change. ? Remove your dressing 3 days after surgery and cover the incisions with dry dressings.    After your dressing, cast or splint is removed; you may shower, but do not soak or scrub the wound.  Allow the water to run over it, and then gently pat it dry.  Swelling It is normal for you to have swelling where you had surgery.  To reduce swelling and pain, keep your toes above your nose for at least 3 days after surgery.  It may be necessary to keep your foot or leg elevated for  several weeks.  If it hurts, it should be elevated.  Follow Up Call my office at [phone] when you are discharged from the hospital or surgery center to schedule an appointment to be seen two weeks after surgery.  Call my office at [phone] if you develop a fever >101.5 F, nausea, vomiting, bleeding from the surgical site or severe pain.      No Tylenol until 6:11 pm   Post Anesthesia Home Care Instructions  Activity: Get plenty of rest for the remainder of the day. A responsible individual must stay with you for 24 hours following the procedure.  For the next 24 hours, DO NOT: -Drive a car -Advertising copywriter -Drink alcoholic beverages -Take any medication unless instructed by your physician -Make any legal decisions or sign important papers.  Meals: Start with liquid foods such as gelatin or soup. Progress to regular foods as tolerated. Avoid greasy, spicy, heavy foods. If nausea and/or vomiting occur, drink only clear liquids until the nausea and/or vomiting subsides. Call your physician if vomiting continues.  Special Instructions/Symptoms: Your throat may feel dry or sore from the anesthesia or the breathing tube placed in your throat during surgery. If this causes discomfort, gargle with warm salt water. The discomfort should disappear within 24 hours.  If you had a scopolamine patch placed behind your ear for the management of post- operative nausea  and/or vomiting:  1. The medication in the patch is effective for 72 hours, after which it should be removed.  Wrap patch in a tissue and discard in the trash. Wash hands thoroughly with soap and water. 2. You may remove the patch earlier than 72 hours if you experience unpleasant side effects which may include dry mouth, dizziness or visual disturbances. 3. Avoid touching the patch. Wash your hands with soap and water after contact with the patch.    Regional Anesthesia Blocks  1. Numbness or the inability to move the  "blocked" extremity may last from 3-48 hours after placement. The length of time depends on the medication injected and your individual response to the medication. If the numbness is not going away after 48 hours, call your surgeon.  2. The extremity that is blocked will need to be protected until the numbness is gone and the  Strength has returned. Because you cannot feel it, you will need to take extra care to avoid injury. Because it may be weak, you may have difficulty moving it or using it. You may not know what position it is in without looking at it while the block is in effect.  3. For blocks in the legs and feet, returning to weight bearing and walking needs to be done carefully. You will need to wait until the numbness is entirely gone and the strength has returned. You should be able to move your leg and foot normally before you try and bear weight or walk. You will need someone to be with you when you first try to ensure you do not fall and possibly risk injury.  4. Bruising and tenderness at the needle site are common side effects and will resolve in a few days.  5. Persistent numbness or new problems with movement should be communicated to the surgeon or the [facility] [phone] [facility] [phone]).

## 2021-01-16 NOTE — Transfer of Care (Signed)
Immediate Anesthesia Transfer of Care Note  Patient: Mary Orozco  Procedure(s) Performed: OPEN REDUCTION INTERNAL FIXATION (ORIF) FOOT LISFRANC FRACTURE (Left Foot)  Patient Location: PACU  Anesthesia Type:General and GA combined with regional for post-op pain  Level of Consciousness: sedated  Airway & Oxygen Therapy: Patient Spontanous Breathing and Patient connected to face mask oxygen  Post-op Assessment: Report given to RN and Post -op Vital signs reviewed and stable  Post vital signs: Reviewed and stable  Last Vitals:  Vitals Value Taken Time  BP    Temp    Pulse 87 01/16/21 1452  Resp    SpO2 92 % 01/16/21 1452  Vitals shown include unvalidated device data.  Last Pain:  Vitals:   01/16/21 1200  TempSrc: Oral  PainSc: 6          Complications: No complications documented.

## 2021-01-16 NOTE — Progress Notes (Signed)
Assisted Dr. Finucane with left, ultrasound guided, popliteal, adductor canal block. Side rails up, monitors on throughout procedure. See vital signs in flow sheet. Tolerated Procedure well. 

## 2021-01-16 NOTE — Anesthesia Postprocedure Evaluation (Signed)
Anesthesia Post Note  Patient: Mary Orozco  Procedure(s) Performed: OPEN REDUCTION INTERNAL FIXATION (ORIF) FOOT LISFRANC FRACTURE (Left Foot)     Patient location during evaluation: PACU Anesthesia Type: Regional and General Level of consciousness: awake and alert Pain management: pain level controlled Vital Signs Assessment: post-procedure vital signs reviewed and stable Respiratory status: spontaneous breathing, nonlabored ventilation and respiratory function stable Cardiovascular status: blood pressure returned to baseline and stable Postop Assessment: no apparent nausea or vomiting Anesthetic complications: no   No complications documented.  Last Vitals:  Vitals:   01/16/21 1530 01/16/21 1543  BP: 119/72 122/70  Pulse: 89 89  Resp: 20 20  Temp:  36.9 C  SpO2: 94% 96%    Last Pain:  Vitals:   01/16/21 1543  TempSrc:   PainSc: 0-No pain                 Lowella Curb

## 2021-01-16 NOTE — Anesthesia Procedure Notes (Signed)
Procedure Name: LMA Insertion Date/Time: 01/16/2021 1:51 PM Performed by: Lauralyn Primes, CRNA Pre-anesthesia Checklist: Patient identified, Emergency Drugs available, Suction available and Patient being monitored Patient Re-evaluated:Patient Re-evaluated prior to induction Oxygen Delivery Method: Circle system utilized Preoxygenation: Pre-oxygenation with 100% oxygen Induction Type: IV induction Ventilation: Mask ventilation without difficulty LMA: LMA inserted LMA Size: 4.0 Number of attempts: 1 Airway Equipment and Method: Bite block Placement Confirmation: positive ETCO2 Tube secured with: Tape Dental Injury: Teeth and Oropharynx as per pre-operative assessment

## 2021-01-16 NOTE — Anesthesia Preprocedure Evaluation (Addendum)
Anesthesia Evaluation  Patient identified by MRN, date of birth, ID band Patient awake    Reviewed: Allergy & Precautions, NPO status , Patient's Chart, lab work & pertinent test results  History of Anesthesia Complications (+) PONV and history of anesthetic complications  Airway Mallampati: I  TM Distance: >3 FB Neck ROM: Full    Dental no notable dental hx. (+) Teeth Intact, Dental Advisory Given   Pulmonary neg pulmonary ROS,    Pulmonary exam normal breath sounds clear to auscultation       Cardiovascular negative cardio ROS Normal cardiovascular exam Rhythm:Regular Rate:Normal     Neuro/Psych  Headaches, negative psych ROS   GI/Hepatic Neg liver ROS, GERD  Medicated and Controlled,  Endo/Other  Morbid obesityBMI 43  Renal/GU negative Renal ROS  negative genitourinary   Musculoskeletal L 2nd metatarsal fx Chronic pain- whole body per pt   Abdominal (+) + obese,   Peds  Hematology  (+) Blood dyscrasia, anemia ,   Anesthesia Other Findings   Reproductive/Obstetrics negative OB ROS                           Anesthesia Physical Anesthesia Plan  ASA: III  Anesthesia Plan: General and Regional   Post-op Pain Management: GA combined w/ Regional for post-op pain   Induction: Intravenous  PONV Risk Score and Plan: Ondansetron, Dexamethasone, Propofol infusion, TIVA, Midazolam, Treatment may vary due to age or medical condition and Scopolamine patch - Pre-op  Airway Management Planned: LMA  Additional Equipment: None  Intra-op Plan:   Post-operative Plan: Extubation in OR  Informed Consent: I have reviewed the patients History and Physical, chart, labs and discussed the procedure including the risks, benefits and alternatives for the proposed anesthesia with the patient or authorized representative who has indicated his/her understanding and acceptance.     Dental advisory  given  Plan Discussed with: CRNA  Anesthesia Plan Comments: (Vague history of methemoglobinemia, but uses topical lidocaine regularly for chronic pain (arthritis in neck, arms, back))       Anesthesia Quick Evaluation

## 2021-01-16 NOTE — Op Note (Signed)
01/16/2021  2:54 PM  PATIENT:  Mary Orozco  25 y.o. female  PRE-OPERATIVE DIAGNOSIS: Left foot Lisfranc fracture dislocation  POST-OPERATIVE DIAGNOSIS: Left foot Lisfranc fracture dislocation with second metatarsal base fracture, second TMT joint instability and medial intercuneiform instability  Procedure(s): 1.  Open treatment of left second tarsometatarsal joint dislocation with internal fixation 2.  Open treatment of left medial intercuneiform joint dislocation with internal fixation 3.  Stress examination of the left foot under fluoroscopy 4.  AP, lateral and oblique radiographs of the left foot   SURGEON:  Toni Arthurs, MD  ASSISTANT: None  ANESTHESIA:   General, regional  EBL:  minimal   TOURNIQUET:   Total Tourniquet Time Documented: Thigh (Left) - 32 minutes Total: Thigh (Left) - 32 minutes  COMPLICATIONS:  None apparent  DISPOSITION:  Extubated, awake and stable to recovery.  INDICATION FOR PROCEDURE: The patient is a 25 year old female with a past medical history significant for anxiety and depression.  She stepped off a curb over a week ago twisting her left foot and injuring it.  She had radiographs which were concerning for a Lisfranc injury.  An MRI confirmed rupture of the Lisfranc ligament as well as a small second metatarsal base avulsion fracture.  She presents now for open treatment with internal fixation of the Lisfranc injury.  The risks and benefits of the alternative treatment options have been discussed in detail.  The patient wishes to proceed with surgery and specifically understands risks of bleeding, infection, nerve damage, blood clots, need for additional surgery, amputation and death.  PROCEDURE IN DETAIL:  After pre operative consent was obtained, and the correct operative site was identified, the patient was brought to the operating room and placed supine on the OR table.  Anesthesia was administered.  Pre-operative antibiotics were  administered.  A surgical timeout was taken.  The left lower extremity was prepped and draped in standard sterile fashion with a tourniquet around the thigh.  The extremity was elevated and the tourniquet was inflated to 250 mmHg.  A stress examination was then performed for the left foot.  An AP radiograph was obtained.  Abduction and abduction stress was applied under live fluoroscopy.  There was instability noted at the second TMT joint as well as the medial intercuneiform joint.  The first TMT joint appeared to be stable.  The decision was made to proceed with open treatment with internal fixation.  A longitudinal incision was made over the first and second tarsometatarsal joints.  Dissection was carried down through the subcutaneous tissues.  The interval between the extensor houses longus and brevis tendons was developed.  The neurovascular bundle was protected.  The Lisfranc interval was opened.  Hematoma was removed with suction and an elevator.  The Lisfranc interval was reduced and held with a pointed tenaculum.  AP and oblique radiographs confirmed appropriate reduction of the first and second TMT joints and the medial intercuneiform joint.  An incision was then made over the medial midfoot.  Dissection was carried down to the medial cuneiform.  A K wire was inserted across the Lisfranc interval from the medial cuneiform to the second metatarsal base.  A radiograph confirmed appropriate position of the guidepin.  It was overdrilled and removed.  A 4 mm fully threaded solid screw was inserted and was noted to have excellent purchase.  Radiographs confirmed appropriate position of the screw.  A guidepin was then advanced from the medial cuneiform to the middle cuneiform across the intercuneiform joint.  Radiographs confirmed appropriate position of the guidepin.  Was overdrilled and removed.  A 4 mm fully threaded solid screw was inserted.  All hardware was from the [employer] titanium small frag  set.  AP, oblique and lateral radiographs of the left foot show interval reduction of the intercuneiform and second TMT joints.  Hardware is appropriately positioned and of the appropriate lengths.  The wounds were irrigated copiously and sprinkled with vancomycin powder.  Subcutaneous tissues were approximated with Monocryl.  Skin incisions were closed with nylon.  Sterile dressings were applied followed by a well-padded short leg splint.  The tourniquet was released after application of the dressings.  The patient was awakened from anesthesia and transported to the recovery room in stable condition.   FOLLOW UP PLAN: Nonweightbearing on the left lower extremity.  Follow-up in the office in 2 weeks for suture removal and conversion to a short leg cast.  Xarelto for DVT prophylaxis.  RADIOGRAPHS: AP, lateral and oblique radiographs of the left foot are obtained intraoperatively.  These show interval reduction and fixation of the Lisfranc interval and the medial intercuneiform joint.  Hardware is appropriately positioned and of the appropriate lengths.  No other acute injuries are noted.

## 2021-01-16 NOTE — H&P (Signed)
Mary Orozco is an 25 y.o. female.   Chief Complaint: Left foot injury HPI: The patient is a 25 year old female with a past medical history significant for depression and anxiety.  She injured her foot over a week ago when she stepped off a curb.  She has radiographs indicating a rupture of her Lisfranc ligament with displacement of the second metatarsal base.  She presents now for operative treatment of this displaced and unstable left midfoot injury.  Past Medical History:  Diagnosis Date  . Cystitis   . IBS (irritable bowel syndrome)   . Migraine   . PONV (postoperative nausea and vomiting)   . Renal disorder    kidney stones    Past Surgical History:  Procedure Laterality Date  . WISDOM TOOTH EXTRACTION      Family History  Problem Relation Age of Onset  . Diabetes Other   . CAD Other    Social History:  reports that she has never smoked. She has never used smokeless tobacco. She reports that she does not drink alcohol and does not use drugs.  Allergies:  Allergies  Allergen Reactions  . Duloxetine Hcl Nausea Only and Other (See Comments)  . Hydroxyzine Hcl Other (See Comments)    Medication does not have therapeutic effect  . Lidocaine Other (See Comments)    Other reaction(s): metheglobinemia  . Other Other (See Comments)  . Diflucan [Fluconazole] Rash  . Sulfa Antibiotics Rash  . Sulfasalazine Rash    Medications Prior to Admission  Medication Sig Dispense Refill  . acetaminophen (TYLENOL) 500 MG tablet Take 500 mg by mouth every 6 (six) hours as needed.    . ALPRAZolam (XANAX) 0.5 MG tablet Take 0.5 mg by mouth 2 (two) times daily as needed for anxiety.    Marland Kitchen b complex vitamins capsule Take 1 capsule by mouth daily. 30 capsule 3  . Cyanocobalamin (B-12) 1000 MCG SUBL Place 2,000 mcg under the tongue daily. 30 tablet 3  . ergocalciferol (VITAMIN D2) 1.25 MG (50000 UT) capsule Take 1 capsule (50,000 Units total) by mouth 2 (two) times a week. 24 capsule 1  .  esomeprazole (NEXIUM) 40 MG capsule Take 40 mg by mouth daily at 12 noon.    . nitrofurantoin (MACRODANTIN) 100 MG capsule Take 100 mg by mouth daily.     . ondansetron (ZOFRAN) 4 MG tablet Take 1 tablet (4 mg total) by mouth every 6 (six) hours. 12 tablet 0  . ORILISSA 150 MG TABS Take 1 tablet by mouth at bedtime.    Marland Kitchen oxyCODONE-acetaminophen (PERCOCET) 5-325 MG tablet Take 1 tablet by mouth every 4 (four) hours as needed. 15 tablet 0  . pregabalin (LYRICA) 150 MG capsule Take 150 mg by mouth 2 (two) times daily.    . sertraline (ZOLOFT) 100 MG tablet 1 tablet      Results for orders placed or performed during the hospital encounter of 01/16/21 (from the past 48 hour(s))  Pregnancy, urine POC     Status: None   Collection Time: 01/16/21 11:49 AM  Result Value Ref Range   Preg Test, Ur NEGATIVE NEGATIVE    Comment:        THE SENSITIVITY OF THIS METHODOLOGY IS >24 mIU/mL    No results found.  Review of Systems no recent fever, chills, nausea, vomiting or changes in her appetite  Blood pressure 135/71, pulse 98, temperature 98.6 F (37 C), temperature source Oral, resp. rate 14, height 5\' 4"  (1.626 m), weight 119.4 kg,  last menstrual period 12/14/2020, SpO2 100 %. Physical Exam  Well-nourished well-developed woman in no apparent distress.  Alert and oriented x4.  Normal mood and affect.  Gait is nonweightbearing on the left.  Left foot has moderate swelling.  Skin is healthy and intact.  Pulses are palpable in the foot.  No lymphadenopathy.  Sensibility to light touch is intact dorsally and plantarly at the forefoot.  5 out of 5 strength in plantarflexion and dorsiflexion of the ankle and toes.  Assessment/Plan Left foot Lisfranc fracture dislocation -to the operating room today for open treatment with internal fixation.  The risks and benefits of the alternative treatment options have been discussed in detail.  The patient wishes to proceed with surgery and specifically understands  risks of bleeding, infection, nerve damage, blood clots, need for additional surgery, amputation and death.   Toni Arthurs, MD 01-22-2021, 1:39 PM

## 2021-01-17 ENCOUNTER — Encounter (HOSPITAL_BASED_OUTPATIENT_CLINIC_OR_DEPARTMENT_OTHER): Payer: Self-pay | Admitting: Orthopedic Surgery

## 2021-01-24 ENCOUNTER — Inpatient Hospital Stay: Payer: 59

## 2021-01-31 DIAGNOSIS — S92902D Unspecified fracture of left foot, subsequent encounter for fracture with routine healing: Secondary | ICD-10-CM | POA: Diagnosis not present

## 2021-02-06 DIAGNOSIS — N809 Endometriosis, unspecified: Secondary | ICD-10-CM | POA: Diagnosis not present

## 2021-02-06 DIAGNOSIS — G8929 Other chronic pain: Secondary | ICD-10-CM | POA: Diagnosis not present

## 2021-02-06 DIAGNOSIS — R102 Pelvic and perineal pain: Secondary | ICD-10-CM | POA: Diagnosis not present

## 2021-02-06 DIAGNOSIS — G4701 Insomnia due to medical condition: Secondary | ICD-10-CM | POA: Diagnosis not present

## 2021-02-28 DIAGNOSIS — M79672 Pain in left foot: Secondary | ICD-10-CM | POA: Diagnosis not present

## 2021-04-10 ENCOUNTER — Other Ambulatory Visit: Payer: Self-pay | Admitting: Hematology

## 2021-04-11 ENCOUNTER — Other Ambulatory Visit: Payer: Self-pay

## 2021-04-11 DIAGNOSIS — D500 Iron deficiency anemia secondary to blood loss (chronic): Secondary | ICD-10-CM

## 2021-04-11 MED ORDER — VITAMIN B-12 1000 MCG PO TABS: 1000.0000 ug | ORAL_TABLET | Freq: Every day | ORAL | 2 refills | Status: AC

## 2021-04-11 MED ORDER — B-12 (METHYLCOBALAMIN) 1000 MCG SL SUBL: SUBLINGUAL_TABLET | SUBLINGUAL | 2 refills | Status: AC

## 2021-05-07 DIAGNOSIS — S93325D Dislocation of tarsometatarsal joint of left foot, subsequent encounter: Secondary | ICD-10-CM | POA: Diagnosis not present

## 2021-06-06 DIAGNOSIS — S93325D Dislocation of tarsometatarsal joint of left foot, subsequent encounter: Secondary | ICD-10-CM | POA: Diagnosis not present

## 2021-06-19 DIAGNOSIS — M79672 Pain in left foot: Secondary | ICD-10-CM | POA: Diagnosis not present

## 2021-06-26 ENCOUNTER — Inpatient Hospital Stay: Payer: BC Managed Care – PPO | Admitting: Hematology

## 2021-06-26 ENCOUNTER — Inpatient Hospital Stay: Payer: BC Managed Care – PPO

## 2021-07-07 DIAGNOSIS — M79672 Pain in left foot: Secondary | ICD-10-CM | POA: Diagnosis not present

## 2021-07-12 ENCOUNTER — Encounter: Payer: Self-pay | Admitting: Hematology

## 2021-07-14 ENCOUNTER — Inpatient Hospital Stay: Payer: BC Managed Care – PPO | Attending: Hematology

## 2021-07-14 ENCOUNTER — Inpatient Hospital Stay (HOSPITAL_BASED_OUTPATIENT_CLINIC_OR_DEPARTMENT_OTHER): Payer: BC Managed Care – PPO | Admitting: Hematology

## 2021-07-14 ENCOUNTER — Other Ambulatory Visit: Payer: Self-pay

## 2021-07-14 VITALS — BP 104/56 | HR 79 | Temp 97.8°F | Resp 17 | Ht 64.0 in | Wt 266.1 lb

## 2021-07-14 DIAGNOSIS — D509 Iron deficiency anemia, unspecified: Secondary | ICD-10-CM | POA: Insufficient documentation

## 2021-07-14 DIAGNOSIS — D500 Iron deficiency anemia secondary to blood loss (chronic): Secondary | ICD-10-CM

## 2021-07-14 DIAGNOSIS — Z79899 Other long term (current) drug therapy: Secondary | ICD-10-CM | POA: Insufficient documentation

## 2021-07-14 DIAGNOSIS — E559 Vitamin D deficiency, unspecified: Secondary | ICD-10-CM | POA: Insufficient documentation

## 2021-07-14 DIAGNOSIS — E538 Deficiency of other specified B group vitamins: Secondary | ICD-10-CM

## 2021-07-14 LAB — CBC WITH DIFFERENTIAL/PLATELET
Abs Immature Granulocytes: 0.01 10*3/uL (ref 0.00–0.07)
Basophils Absolute: 0 10*3/uL (ref 0.0–0.1)
Basophils Relative: 1 %
Eosinophils Absolute: 0.1 10*3/uL (ref 0.0–0.5)
Eosinophils Relative: 2 %
HCT: 37.9 % (ref 36.0–46.0)
Hemoglobin: 12.4 g/dL (ref 12.0–15.0)
Immature Granulocytes: 0 %
Lymphocytes Relative: 29 %
Lymphs Abs: 1.7 10*3/uL (ref 0.7–4.0)
MCH: 26.7 pg (ref 26.0–34.0)
MCHC: 32.7 g/dL (ref 30.0–36.0)
MCV: 81.7 fL (ref 80.0–100.0)
Monocytes Absolute: 0.5 10*3/uL (ref 0.1–1.0)
Monocytes Relative: 9 %
Neutro Abs: 3.5 10*3/uL (ref 1.7–7.7)
Neutrophils Relative %: 59 %
Platelets: 315 10*3/uL (ref 150–400)
RBC: 4.64 MIL/uL (ref 3.87–5.11)
RDW: 14 % (ref 11.5–15.5)
WBC: 5.8 10*3/uL (ref 4.0–10.5)
nRBC: 0 % (ref 0.0–0.2)

## 2021-07-14 LAB — VITAMIN B12: Vitamin B-12: 144 pg/mL — ABNORMAL LOW (ref 180–914)

## 2021-07-15 ENCOUNTER — Telehealth: Payer: Self-pay | Admitting: Hematology

## 2021-07-15 LAB — IRON AND TIBC
Iron: 65 ug/dL (ref 41–142)
Saturation Ratios: 18 % — ABNORMAL LOW (ref 21–57)
TIBC: 353 ug/dL (ref 236–444)
UIBC: 287 ug/dL (ref 120–384)

## 2021-07-15 LAB — FERRITIN: Ferritin: 45 ng/mL (ref 11–307)

## 2021-07-15 NOTE — Telephone Encounter (Signed)
Left message with follow-up appointment per 10/3 los. 

## 2021-07-21 ENCOUNTER — Encounter: Payer: Self-pay | Admitting: Hematology

## 2021-07-21 NOTE — Progress Notes (Addendum)
HEMATOLOGY/ONCOLOGY CLINIC NOTE  Date of Service: 07/21/2021  Patient Care Team: System, Provider Not In as PCP - General  CHIEF COMPLAINTS/PURPOSE OF CONSULTATION:  Anemia  HISTORY OF PRESENTING ILLNESS:   Mary Orozco is a wonderful 25 y.o. female who has been referred to [place] by Jarrett Soho, PA-C for evaluation and management of anemia. Pt is accompanied today by her husband. The pt reports that she is doing well overall.   The pt reports that she went to the hospital in December of 2017 for dysuria and pelvis pain. Pt has a history of intersitial cystitis. During this time she was found to have an oxygen saturation of 85%. Hemolysis and methemoglobinemia were also concerns at this time. The hospitalist were suspcious of G6PD deficiency, which the pt reports was confirmed on multiple labs after discharge. She has been on Macrodantin daily since 2017.  Pt has heavy menstrual cycles and endometriosis. Her menstrual cycles last 4-5 days, with two being extremely heavy. She started [place] four weeks ago. She had heavy nose bleeds as a child which improved as aged. Pt took Floradix for a year with no correction or significant improvement in her blood counts. She has previously used 50K IU Vitamin D weekly, which did not do much to improve her Vitamin D deficiency. She is currently getting her Vitamin D and Vitamin B12 supplementation in the form of gummies.   Pt has been diagnosed with Fibromyalgia and Arthritis. She was seen by Rheumatology who felt there were no concerns for any autoimmune disorders. Pt has diarrhea predominant IBS and had been hospitalized for gastritis in the past. She has been on Omeprazole and Zantac to control her GERD for a few years. She denies having a gluten intolerance. Pt reports rash with Diflucan and Sulfa antibiotics. She denies any anaphylaxis with these medications.   Most recent lab results (07/08/2020) of CBC is as follows: all values are WNL  except for Hgb at 8.8, HCT at 28.6, MCV at 59.4, MCH at 18.3, MCHC at 30.8, RDW at 19.5, PLT at 473K, Baso Rel at 1.1, Total Bilirubin at 0.2. 07/08/2020 TIBC at 578, Iron at 15, Iron Sat at 3, Transferrin at 413 07/08/2020 Vitamin D at 17.6 06/27/2020 Vitamin B12 at 184  On review of systems, pt reports fatigue, imbalance, abdominal pain and denies unexpected weight loss, leg swelling and any other symptoms.   On PMHx the pt reports GERD, IBS, Fibromyalgia, Arthritis, Menorrhagia, Interstitial Cystitis, G6PD deficiency, Endometriosis, IDA, Vitamin D deficiency, Vitamin B12 deficiency. On Family Hx the pt reports no bleeding disorders. Her mother, aunt, and grandmother have thyroid disorders.   INTERVAL HISTORY  Mary Orozco is a wonderful 25 y.o. female who is here today for evaluation and management of anemia. The patient's last visit with [place] was on 12/29/2020. The pt reports that she is doing well overall. Patient notes she tolerated the last iron infusions well without any issues.  Currently energy levels are better but still with some chronic fatigue.  She notes that she is recovering from an ankle injury. Patient notes she has not really been taking her B12 regularly.  Labs today reviewed with the patient.  MEDICAL HISTORY:  Past Medical History:  Diagnosis Date   Cystitis    IBS (irritable bowel syndrome)    Migraine    PONV (postoperative nausea and vomiting)    Renal disorder    kidney stones    SURGICAL HISTORY: Past Surgical History:  Procedure Laterality Date  OPEN REDUCTION INTERNAL FIXATION (ORIF) FOOT LISFRANC FRACTURE Left 01/16/2021   Procedure: OPEN REDUCTION INTERNAL FIXATION (ORIF) FOOT LISFRANC FRACTURE;  Surgeon: Toni Arthurs, MD;  Location: [facility];  Service: Orthopedics;  Laterality: Left;    WISDOM TOOTH EXTRACTION      SOCIAL HISTORY: Social History   Socioeconomic History   Marital status: Married    Spouse name: Not on  file   Number of children: Not on file   Years of education: Not on file   Highest education level: Not on file  Occupational History   Not on file  Tobacco Use   Smoking status: Never   Smokeless tobacco: Never  Vaping Use   Vaping Use: Never used  Substance and Sexual Activity   Alcohol use: No   Drug use: No   Sexual activity: Yes  Other Topics Concern   Not on file  Social History Narrative   Not on file   Social Determinants of Health   Financial Resource Strain: Not on file  Food Insecurity: Not on file  Transportation Needs: Not on file  Physical Activity: Not on file  Stress: Not on file  Social Connections: Not on file  Intimate Partner Violence: Not on file    FAMILY HISTORY: Family History  Problem Relation Age of Onset   Diabetes Other    CAD Other     ALLERGIES:  is allergic to duloxetine hcl, hydroxyzine hcl, lidocaine, other, diflucan [fluconazole], sulfa antibiotics, and sulfasalazine.  MEDICATIONS:  Current Outpatient Medications  Medication Sig Dispense Refill   acetaminophen (TYLENOL) 500 MG tablet Take 500 mg by mouth every 6 (six) hours as needed.     ALPRAZolam (XANAX) 0.5 MG tablet Take 0.5 mg by mouth 2 (two) times daily as needed for anxiety.     b complex vitamins capsule Take 1 capsule by mouth daily. 30 capsule 3   B-12, Methylcobalamin, 1000 MCG SUBL DISSOLVE 2 TABLETS UNDER THE TONGUE ONCE DAILY 60 tablet 2   ergocalciferol (VITAMIN D2) 1.25 MG (50000 UT) capsule Take 1 capsule (50,000 Units total) by mouth 2 (two) times a week. 24 capsule 1   esomeprazole (NEXIUM) 40 MG capsule Take 40 mg by mouth daily at 12 noon.     nitrofurantoin (MACRODANTIN) 100 MG capsule Take 100 mg by mouth daily.      ondansetron (ZOFRAN) 4 MG tablet Take 1 tablet (4 mg total) by mouth every 6 (six) hours. 12 tablet 0   ORILISSA 150 MG TABS Take 1 tablet by mouth at bedtime.     pregabalin (LYRICA) 150 MG capsule Take 150 mg by mouth 2 (two) times daily.      rivaroxaban (XARELTO) 10 MG TABS tablet Take 1 tablet (10 mg total) by mouth daily for 13 days. 14 tablet 0   sertraline (ZOLOFT) 100 MG tablet 1 tablet     vitamin B-12 (CYANOCOBALAMIN) 1000 MCG tablet Take 1 tablet (1,000 mcg total) by mouth daily. Dissolve 2 Tablets under the tongue once daily 60 tablet 2   No current facility-administered medications for this visit.    REVIEW OF SYSTEMS:  .10 Point review of Systems was done is negative except as noted above.  PHYSICAL EXAMINATION: ECOG PERFORMANCE STATUS: 2 - Symptomatic, <50% confined to bed  . Vitals:   07/14/21 1534  BP: (!) 104/56  Pulse: 79  Resp: 17  Temp: 97.8 F (36.6 C)  SpO2: 97%   Filed Weights   07/14/21 1534  Weight: 266  lb 1.6 oz (120.7 kg)   .Body mass index is 45.68 kg/m.  Exam was given in a wheelchair. Marland Kitchen GENERAL:alert, in no acute distress and comfortable SKIN: no acute rashes, no significant lesions EYES: conjunctiva are pink and non-injected, sclera anicteric OROPHARYNX: MMM, no exudates, no oropharyngeal erythema or ulceration NECK: supple, no JVD LYMPH:  no palpable lymphadenopathy in the cervical, axillary or inguinal regions LUNGS: clear to auscultation b/l with normal respiratory effort HEART: regular rate & rhythm ABDOMEN:  normoactive bowel sounds , non tender, not distended. Extremity: no pedal edema PSYCH: alert & oriented x 3 with fluent speech NEURO: no focal motor/sensory deficits   LABORATORY DATA:  I have reviewed the data as listed  . CBC Latest Ref Rng & Units 07/14/2021 12/19/2020 09/19/2020  WBC 4.0 - 10.5 K/uL 5.8 7.9 6.1  Hemoglobin 12.0 - 15.0 g/dL 93.2 11.7(L) 8.3(L)  Hematocrit 36.0 - 46.0 % 37.9 38.3 30.3(L)  Platelets 150 - 400 K/uL 315 334 368    . CMP Latest Ref Rng & Units 09/19/2020 08/23/2018 03/27/2017  Glucose 70 - 99 mg/dL 93 89 96  BUN 6 - 20 mg/dL 9 11 7   Creatinine 0.44 - 1.00 mg/dL 6.71 2.45  Sodium 135 - 145 mmol/L 139 137 135  Potassium  3.5 - 5.1 mmol/L 4.1 4.3 4.1  Chloride 98 - 111 mmol/L 105 105 102  CO2 22 - 32 mmol/L 25 24 25   Calcium 8.9 - 10.3 mg/dL 9.6 9.4 9.1  Total Protein 6.5 - 8.1 g/dL 8.0 7.2 -  Total Bilirubin 0.3 - 1.2 mg/dL 8.09) 0.7 -  Alkaline Phos 38 - 126 U/L 94 85 -  AST 15 - 41 U/L 10(L) 28 -  ALT 0 - 44 U/L 10 15 -   . Lab Results  Component Value Date   IRON 65 07/14/2021   TIBC 353 07/14/2021   IRONPCTSAT 18 (L) 07/14/2021   (Iron and TIBC)  Lab Results  Component Value Date   FERRITIN 45 07/14/2021   B12 -- 144   RADIOGRAPHIC STUDIES: I have personally reviewed the radiological images as listed and agreed with the findings in the report. No results found.  ASSESSMENT & PLAN:  25 yo with   1) Severe Iron deficiency Anemia 2) B12 deficiency 3) Vit D deficiency Intrinsic Factor and Parietal Cell antibodies were both negative and not suggested as factor for her anemia.  PLAN: -Discussed patient's labwork 07/14/2021 CBC shows normal blood counts, ferritin 45 with an iron saturation of 18%. B12 still low at 144 likely due to patient's noncompliance with B12 replacement. -Would recommend taking sublingual B12 and increase dose to 2000 mcg daily -Goal Vitamin D is between 60-90. Continue 50K IU Vitamin D twice weekly.  And follow-up with primary care physician -Continue B-complex. -Recommended pt have labs with PCP within next 3 months. -Will see back in 6 months with labs.   FOLLOW UP: RTC with Dr 22 with labs in 6 months   All of the patients questions were answered with apparent satisfaction. The patient knows to call the clinic with any problems, questions or concerns. . The total time spent in the appointment was 20 minutes and more than 50% was on counseling and direct patient cares.   09/13/2021 MD MS AAHIVMS [facility] Hematology/Oncology Physician [facility]

## 2021-11-19 DIAGNOSIS — R102 Pelvic and perineal pain: Secondary | ICD-10-CM | POA: Diagnosis not present

## 2021-11-19 DIAGNOSIS — N809 Endometriosis, unspecified: Secondary | ICD-10-CM | POA: Diagnosis not present

## 2021-11-19 DIAGNOSIS — G4701 Insomnia due to medical condition: Secondary | ICD-10-CM | POA: Diagnosis not present

## 2021-11-19 DIAGNOSIS — G8929 Other chronic pain: Secondary | ICD-10-CM | POA: Diagnosis not present

## 2021-11-20 ENCOUNTER — Other Ambulatory Visit: Payer: Self-pay | Admitting: Hematology

## 2021-11-21 ENCOUNTER — Encounter: Payer: Self-pay | Admitting: Hematology

## 2021-12-04 DIAGNOSIS — Z79899 Other long term (current) drug therapy: Secondary | ICD-10-CM | POA: Diagnosis not present

## 2021-12-04 DIAGNOSIS — M47817 Spondylosis without myelopathy or radiculopathy, lumbosacral region: Secondary | ICD-10-CM | POA: Diagnosis not present

## 2021-12-04 DIAGNOSIS — G8929 Other chronic pain: Secondary | ICD-10-CM | POA: Diagnosis not present

## 2021-12-04 DIAGNOSIS — G8921 Chronic pain due to trauma: Secondary | ICD-10-CM | POA: Diagnosis not present

## 2021-12-04 DIAGNOSIS — M545 Low back pain, unspecified: Secondary | ICD-10-CM | POA: Diagnosis not present

## 2021-12-04 DIAGNOSIS — Z5181 Encounter for therapeutic drug level monitoring: Secondary | ICD-10-CM | POA: Diagnosis not present

## 2021-12-04 DIAGNOSIS — M549 Dorsalgia, unspecified: Secondary | ICD-10-CM | POA: Diagnosis not present

## 2021-12-19 ENCOUNTER — Other Ambulatory Visit: Payer: Self-pay

## 2021-12-19 ENCOUNTER — Encounter: Payer: Self-pay | Admitting: Hematology

## 2021-12-19 ENCOUNTER — Emergency Department (HOSPITAL_BASED_OUTPATIENT_CLINIC_OR_DEPARTMENT_OTHER): Payer: BC Managed Care – PPO

## 2021-12-19 ENCOUNTER — Emergency Department (HOSPITAL_BASED_OUTPATIENT_CLINIC_OR_DEPARTMENT_OTHER)
Admission: EM | Admit: 2021-12-19 | Discharge: 2021-12-20 | Disposition: A | Discharge status: Home / Self Care | Payer: BC Managed Care – PPO | Attending: Emergency Medicine | Admitting: Emergency Medicine

## 2021-12-19 ENCOUNTER — Encounter (HOSPITAL_BASED_OUTPATIENT_CLINIC_OR_DEPARTMENT_OTHER): Payer: Self-pay

## 2021-12-19 DIAGNOSIS — B349 Viral infection, unspecified: Secondary | ICD-10-CM | POA: Diagnosis not present

## 2021-12-19 DIAGNOSIS — R079 Chest pain, unspecified: Secondary | ICD-10-CM

## 2021-12-19 DIAGNOSIS — R918 Other nonspecific abnormal finding of lung field: Secondary | ICD-10-CM | POA: Diagnosis not present

## 2021-12-19 DIAGNOSIS — Z20822 Contact with and (suspected) exposure to covid-19: Secondary | ICD-10-CM | POA: Diagnosis not present

## 2021-12-19 DIAGNOSIS — R0789 Other chest pain: Secondary | ICD-10-CM | POA: Diagnosis not present

## 2021-12-19 DIAGNOSIS — R000 Tachycardia, unspecified: Secondary | ICD-10-CM | POA: Diagnosis not present

## 2021-12-19 LAB — CBC
HCT: 39.5 % (ref 36.0–46.0)
Hemoglobin: 12.7 g/dL (ref 12.0–15.0)
MCH: 25.8 pg — ABNORMAL LOW (ref 26.0–34.0)
MCHC: 32.2 g/dL (ref 30.0–36.0)
MCV: 80.3 fL (ref 80.0–100.0)
Platelets: 277 10*3/uL (ref 150–400)
RBC: 4.92 MIL/uL (ref 3.87–5.11)
RDW: 13.9 % (ref 11.5–15.5)
WBC: 7.7 10*3/uL (ref 4.0–10.5)
nRBC: 0 % (ref 0.0–0.2)

## 2021-12-19 LAB — RESP PANEL BY RT-PCR (FLU A&B, COVID) ARPGX2
Influenza A by PCR: NEGATIVE
Influenza B by PCR: NEGATIVE
SARS Coronavirus 2 by RT PCR: NEGATIVE

## 2021-12-19 LAB — BASIC METABOLIC PANEL
Anion gap: 11 (ref 5–15)
BUN: 8 mg/dL (ref 6–20)
CO2: 24 mmol/L (ref 22–32)
Calcium: 9.2 mg/dL (ref 8.9–10.3)
Chloride: 100 mmol/L (ref 98–111)
Creatinine, Ser: 0.81 mg/dL (ref 0.44–1.00)
GFR, Estimated: >60 mL/min (ref >60)
Glucose, Bld: 126 mg/dL — ABNORMAL HIGH (ref 70–99)
Potassium: 3.4 mmol/L — ABNORMAL LOW (ref 3.5–5.1)
Sodium: 135 mmol/L (ref 135–145)

## 2021-12-19 LAB — TROPONIN I (HIGH SENSITIVITY): Troponin I (High Sensitivity): <2 ng/L (ref <18)

## 2021-12-19 MED ORDER — ACETAMINOPHEN 325 MG PO TABS
650.0000 mg | ORAL_TABLET | Freq: Once | ORAL | Status: AC | PRN
  Administered 2021-12-19: 23:00:00 650 mg via ORAL
  Filled 2021-12-19: qty 2

## 2021-12-19 NOTE — ED Triage Notes (Addendum)
Pt arrives POV with spouse with c/o chest pain that hurts "everywhere" and a migraine.  Per spouse, pt has also had nausea, vomiting, diarrhea, and decrease in appetite. Onset of symptoms 24 hrs ago. Spouse reports that patient has difficulty speaking due to the pain.  Pt is seen by a pain clinic and was prescribed tramadol.  Spouse reports that pt has taken tramadol with no relief. ?

## 2021-12-20 ENCOUNTER — Emergency Department (HOSPITAL_BASED_OUTPATIENT_CLINIC_OR_DEPARTMENT_OTHER): Payer: BC Managed Care – PPO | Admitting: Radiology

## 2021-12-20 DIAGNOSIS — Z0389 Encounter for observation for other suspected diseases and conditions ruled out: Secondary | ICD-10-CM | POA: Diagnosis not present

## 2021-12-20 LAB — PREGNANCY, URINE: Preg Test, Ur: NEGATIVE

## 2021-12-20 LAB — TROPONIN I (HIGH SENSITIVITY): Troponin I (High Sensitivity): <2 ng/L (ref <18)

## 2021-12-20 MED ORDER — KETOROLAC TROMETHAMINE 30 MG/ML IJ SOLN
30.0000 mg | Freq: Once | INTRAMUSCULAR | Status: AC
  Administered 2021-12-20: 30 mg via INTRAVENOUS
  Filled 2021-12-20: qty 1

## 2021-12-20 NOTE — ED Provider Notes (Signed)
? ?[facility]  ?Provider Note ? ?CSN: [id number] ?Arrival date & time: 12/19/21 2053 ? ?History ?Chief Complaint  ?Patient presents with  ? Chest Pain  ? ? ?Mary Orozco is a 26 y.o. female reports she has had two days of nausea, vomiting, diarrhea. Then today began running a fever, having some chest pains, worse with deep breath and a bad headache. She has not had much of a cough.  ? ? ?Home Medications ?Prior to Admission medications   ?Medication Sig Start Date End Date Taking? Authorizing Provider  ?acetaminophen (TYLENOL) 500 MG tablet Take 500 mg by mouth every 6 (six) hours as needed.    [provider]  ?ALPRAZolam Prudy Feeler) 0.5 MG tablet Take 0.5 mg by mouth 2 (two) times daily as needed for anxiety.    [provider]  ?b complex vitamins capsule Take 1 capsule by mouth daily. 09/19/20   Johney Maine, MD  ?B-12, Methylcobalamin, 1000 MCG SUBL DISSOLVE 2 TABLETS UNDER THE TONGUE ONCE DAILY 04/11/21   Johney Maine, MD  ?esomeprazole (NEXIUM) 40 MG capsule Take 40 mg by mouth daily at 12 noon.    [provider]  ?nitrofurantoin (MACRODANTIN) 100 MG capsule Take 100 mg by mouth daily.     [provider]  ?ondansetron (ZOFRAN) 4 MG tablet Take 1 tablet (4 mg total) by mouth every 6 (six) hours. 01/12/15   Raeford Razor, MD  ?ORILISSA 150 MG TABS Take 1 tablet by mouth at bedtime. 09/21/20   [provider]  ?pregabalin (LYRICA) 150 MG capsule Take 150 mg by mouth 2 (two) times daily.    [provider]  ?rivaroxaban (XARELTO) 10 MG TABS tablet Take 1 tablet (10 mg total) by mouth daily for 13 days. 01/17/21 01/30/21  Toni Arthurs, MD  ?sertraline (ZOLOFT) 100 MG tablet 1 tablet    [provider]  ?vitamin B-12 (CYANOCOBALAMIN) 1000 MCG tablet Take 1 tablet (1,000 mcg total) by mouth daily. Dissolve 2 Tablets under the tongue once daily 04/11/21   Johney Maine, MD  ?Vitamin D, Ergocalciferol, (DRISDOL)  1.25 MG (50000 UNIT) CAPS capsule TAKE 1 CAPSULE BY MOUTH TWICE PER WEEK 11/21/21   Johney Maine, MD  ? ? ? ?Allergies    ?Duloxetine hcl, Hydroxyzine hcl, Lidocaine, Other, Diflucan [fluconazole], Sulfa antibiotics, and Sulfasalazine ? ? ?Review of Systems   ?Review of Systems ?Please see HPI for pertinent positives and negatives ? ?Physical Exam ?BP 110/63   Pulse 88   Temp 98.6 ?F (37 ?C) (Oral)   Resp 20   Ht 5\' 4"  (1.626 m)   Wt 117.9 kg   LMP  (LMP Unknown) Comment: pt states she does not have periods due to a medicaiton she is taking  SpO2 97%   BMI 44.63 kg/m?  ? ?Physical Exam ?Vitals and nursing note reviewed.  ?Constitutional:   ?   Appearance: Normal appearance.  ?HENT:  ?   Head: Normocephalic and atraumatic.  ?   Nose: Nose normal.  ?   Mouth/Throat:  ?   Mouth: Mucous membranes are moist.  ?Eyes:  ?   Extraocular Movements: Extraocular movements intact.  ?   Conjunctiva/sclera: Conjunctivae normal.  ?Cardiovascular:  ?   Rate and Rhythm: Normal rate.  ?Pulmonary:  ?   Effort: Pulmonary effort is normal.  ?   Breath sounds: Normal breath sounds.  ?Abdominal:  ?   General: Abdomen is flat.  ?   Palpations: Abdomen is soft.  ?  Tenderness: There is no abdominal tenderness.  ?Musculoskeletal:     ?   General: No swelling. Normal range of motion.  ?   Cervical back: Neck supple.  ?Skin: ?   General: Skin is warm and dry.  ?Neurological:  ?   General: No focal deficit present.  ?   Mental Status: She is alert.  ?Psychiatric:     ?   Mood and Affect: Mood normal.  ? ? ?ED Results / Procedures / Treatments   ?EKG ?EKG Interpretation ? ?Date/Time:  Friday December 19 2021 21:05:45 EST ?Ventricular Rate:  110 ?PR Interval:  156 ?QRS Duration: 90 ?QT Interval:  346 ?QTC Calculation: 468 ?R Axis:   61 ?Text Interpretation: Sinus tachycardia T wave abnormality, consider anterolateral ischemia Abnormal ECG When compared with ECG of 12-Sep-2016 13:21, Nonspecific T wave abnormality NOW PRESENT Rate  faster Confirmed by Susy Frizzle [phone]) on 12/20/2021 12:24:44 AM ? ?Procedures ?Procedures ? ?Medications Ordered in the ED ?Medications  ?ketorolac (TORADOL) 30 MG/ML injection 30 mg (has no administration in time range)  ?acetaminophen (TYLENOL) tablet 650 mg (650 mg Oral Given 12/19/21 2246)  ? ? ?Initial Impression and Plan ? Patient with N/V/D now also having pleuritic chest pains and headache. She was found to have a fever in the ED. pCXR concerning for PNA, requested by Rads to send back for a 2 view. Patient has a G6PD deficiency but CBC is normal today. No signs of hemolysis. She has an unremarkable. BMP and first Trop is neg. Covid/Flu are neg.  ? ?ED Course  ? ?Clinical Course as of 12/20/21 0126  ?Sat Dec 20, 2021  ?0116 Repeat Trop remains normal. 2 view CXR is neg for pneumonia. Patient reports she is feeling better but still having some headache. Will give a dose of toradol for comfort, otherwise she appears to have a nonspecific viral syndrome. Recommend she continue with supportive measures at home and follow up with PCP.  [CS]  ?  ?Clinical Course User Index ?[CS] Pollyann Savoy, MD  ? ? ? ?MDM Rules/Calculators/A&P ?Medical Decision Making ?Given presenting complaint, I considered that admission might be necessary. After review of results from ED lab and/or imaging studies, admission to the hospital is not indicated at this time.  ? ? ?Amount and/or Complexity of Data Reviewed ?External Data Reviewed: notes. ?Labs: ordered. Decision-making details documented in ED Course. ?Radiology: ordered and independent interpretation performed. Decision-making details documented in ED Course. ?ECG/medicine tests: ordered and independent interpretation performed. Decision-making details documented in ED Course. ? ?Risk ?OTC drugs. ?Prescription drug management. ?Decision regarding hospitalization. ? ? ? ?Final Clinical Impression(s) / ED Diagnoses ?Final diagnoses:  ?Viral syndrome  ? ? ?Rx / DC  Orders ?ED Discharge Orders   ? ? None  ? ?  ? ?  ?Pollyann Savoy, MD ?12/20/21 0126 ? ?

## 2021-12-20 NOTE — ED Notes (Signed)
Pt transported to XR.  

## 2021-12-31 ENCOUNTER — Telehealth: Payer: Self-pay | Admitting: Hematology

## 2021-12-31 NOTE — Telephone Encounter (Signed)
Rescheduled per provider pal message left with patient  ?

## 2022-01-08 DIAGNOSIS — G8921 Chronic pain due to trauma: Secondary | ICD-10-CM | POA: Diagnosis not present

## 2022-01-08 DIAGNOSIS — M47817 Spondylosis without myelopathy or radiculopathy, lumbosacral region: Secondary | ICD-10-CM | POA: Diagnosis not present

## 2022-01-12 ENCOUNTER — Other Ambulatory Visit: Payer: BC Managed Care – PPO

## 2022-01-12 ENCOUNTER — Ambulatory Visit: Payer: BC Managed Care – PPO | Admitting: Hematology

## 2022-01-27 ENCOUNTER — Other Ambulatory Visit: Payer: BC Managed Care – PPO

## 2022-01-27 ENCOUNTER — Ambulatory Visit: Payer: BC Managed Care – PPO | Admitting: Hematology

## 2022-02-03 DIAGNOSIS — Z01419 Encounter for gynecological examination (general) (routine) without abnormal findings: Secondary | ICD-10-CM | POA: Diagnosis not present

## 2022-04-02 DIAGNOSIS — M47817 Spondylosis without myelopathy or radiculopathy, lumbosacral region: Secondary | ICD-10-CM | POA: Diagnosis not present

## 2022-04-02 DIAGNOSIS — G8929 Other chronic pain: Secondary | ICD-10-CM | POA: Diagnosis not present

## 2022-04-02 DIAGNOSIS — M545 Low back pain, unspecified: Secondary | ICD-10-CM | POA: Diagnosis not present

## 2022-05-17 ENCOUNTER — Other Ambulatory Visit: Payer: Self-pay | Admitting: Hematology

## 2022-05-18 ENCOUNTER — Encounter: Payer: Self-pay | Admitting: Hematology

## 2022-06-25 DIAGNOSIS — Z5181 Encounter for therapeutic drug level monitoring: Secondary | ICD-10-CM | POA: Diagnosis not present

## 2022-06-25 DIAGNOSIS — M47817 Spondylosis without myelopathy or radiculopathy, lumbosacral region: Secondary | ICD-10-CM | POA: Diagnosis not present

## 2022-06-25 DIAGNOSIS — Z79899 Other long term (current) drug therapy: Secondary | ICD-10-CM | POA: Diagnosis not present

## 2022-06-25 DIAGNOSIS — G8929 Other chronic pain: Secondary | ICD-10-CM | POA: Diagnosis not present

## 2022-06-25 DIAGNOSIS — M545 Low back pain, unspecified: Secondary | ICD-10-CM | POA: Diagnosis not present

## 2022-09-30 IMAGING — DX DG CHEST 1V PORT
1 series · 1 of 1 positions shown · non-contrast
Comparison: Chest x-ray 09/11/2016.

CLINICAL DATA: chest pain that hurts "everywhere" and a migraine.
Per spouse, pt has also had nausea, vomiting, diarrhea, and decrease
in appetite

EXAM:
PORTABLE CHEST 1 VIEW

[chest ap]
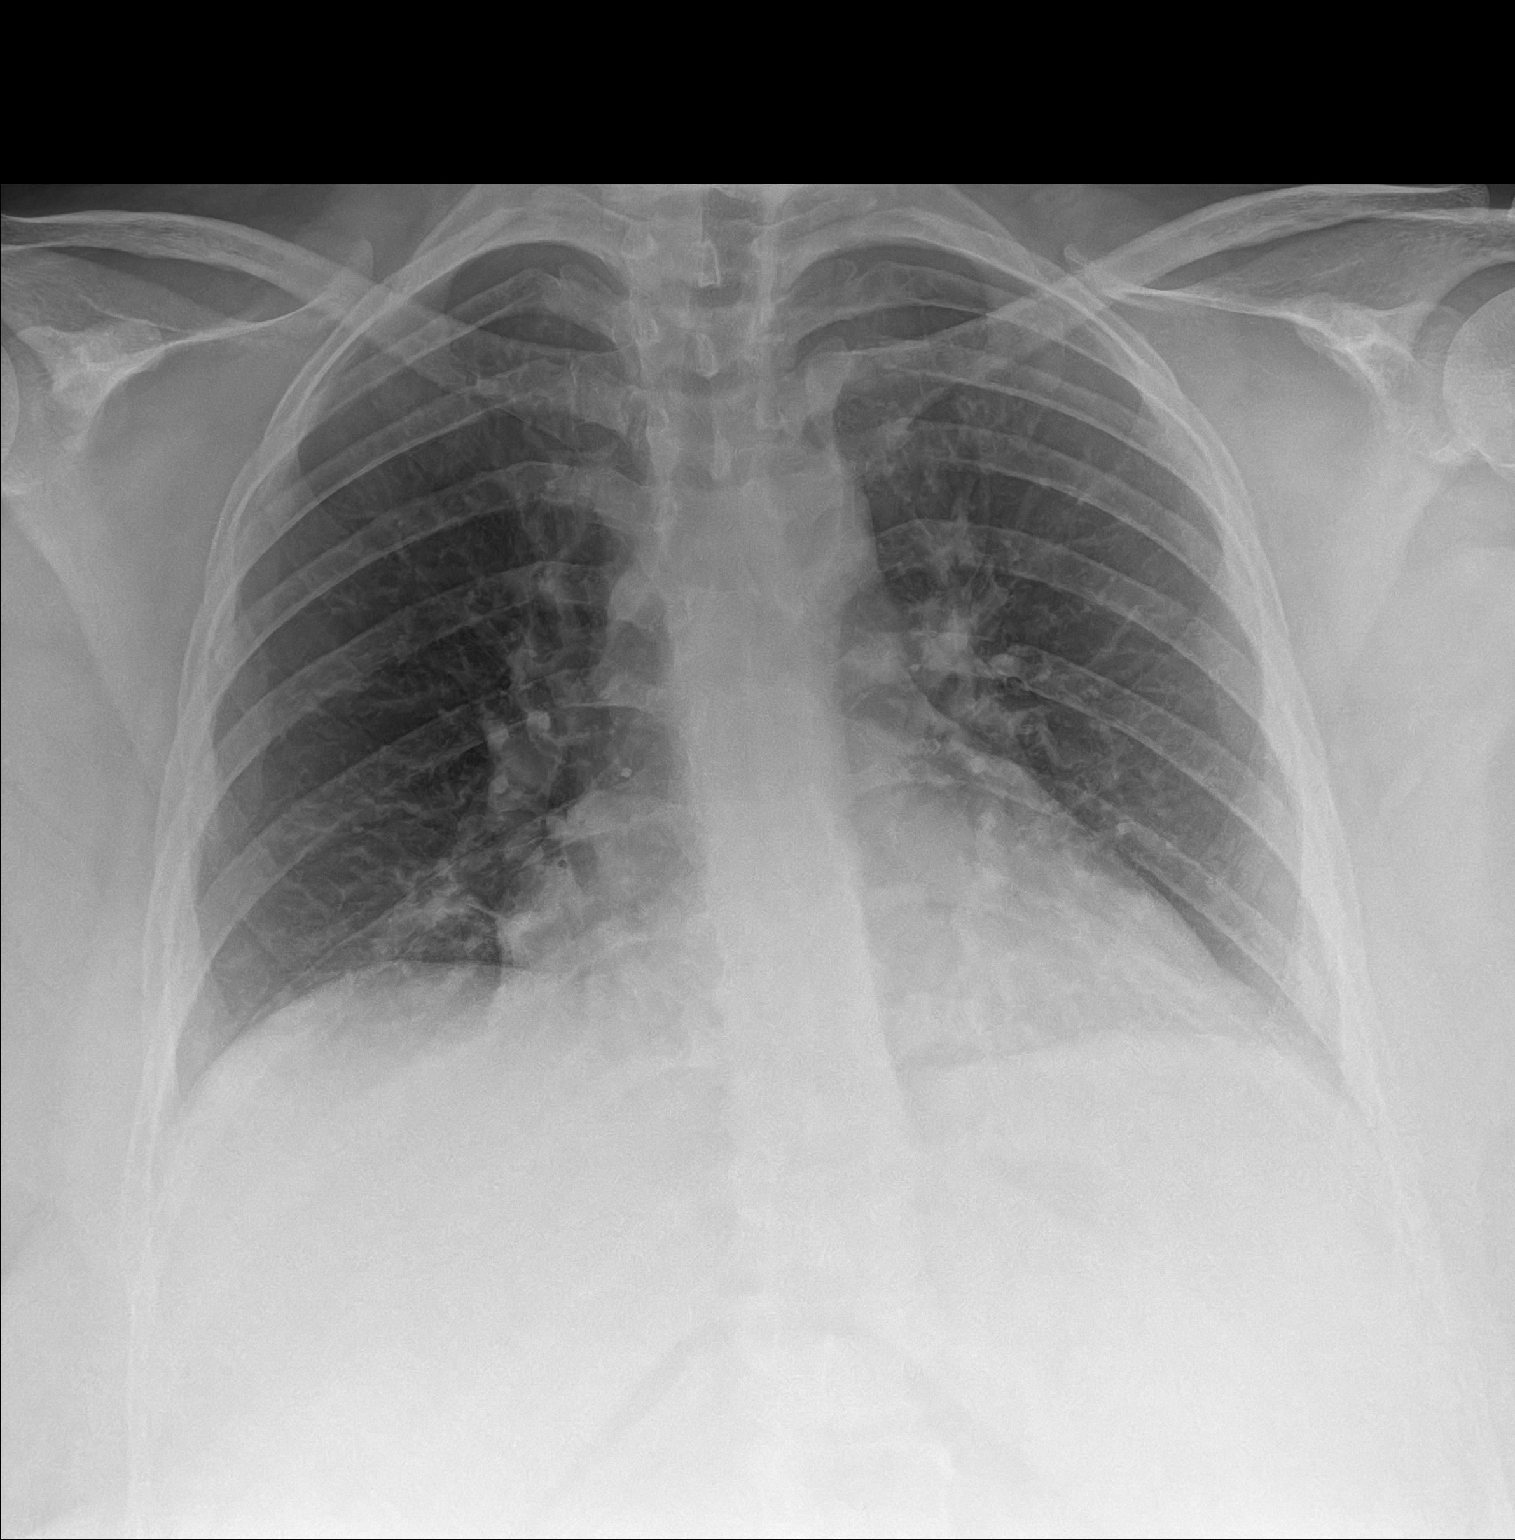

[1 of 1 positions shown; findings below may reference images not displayed]

FINDINGS: The heart and mediastinal contours are within normal limits.

Query right middle lobe airspace opacity. No pulmonary edema. No
pleural effusion. No pneumothorax.

No acute osseous abnormality.
IMPRESSION: Query right middle lobe airspace opacity. Consider repeat PA and
lateral view of the chest for further evaluation.

## 2022-10-01 IMAGING — DX DG CHEST 2V
2 series · 2 of 2 positions shown · non-contrast
Comparison: Portable chest yesterday at [DATE] p.m., PA Lat chest
09/11/2016

CLINICAL DATA: Portable chest yesterday showing possible right
middle lobe airspace disease.

EXAM:
CHEST - 2 VIEW

[chest pa]
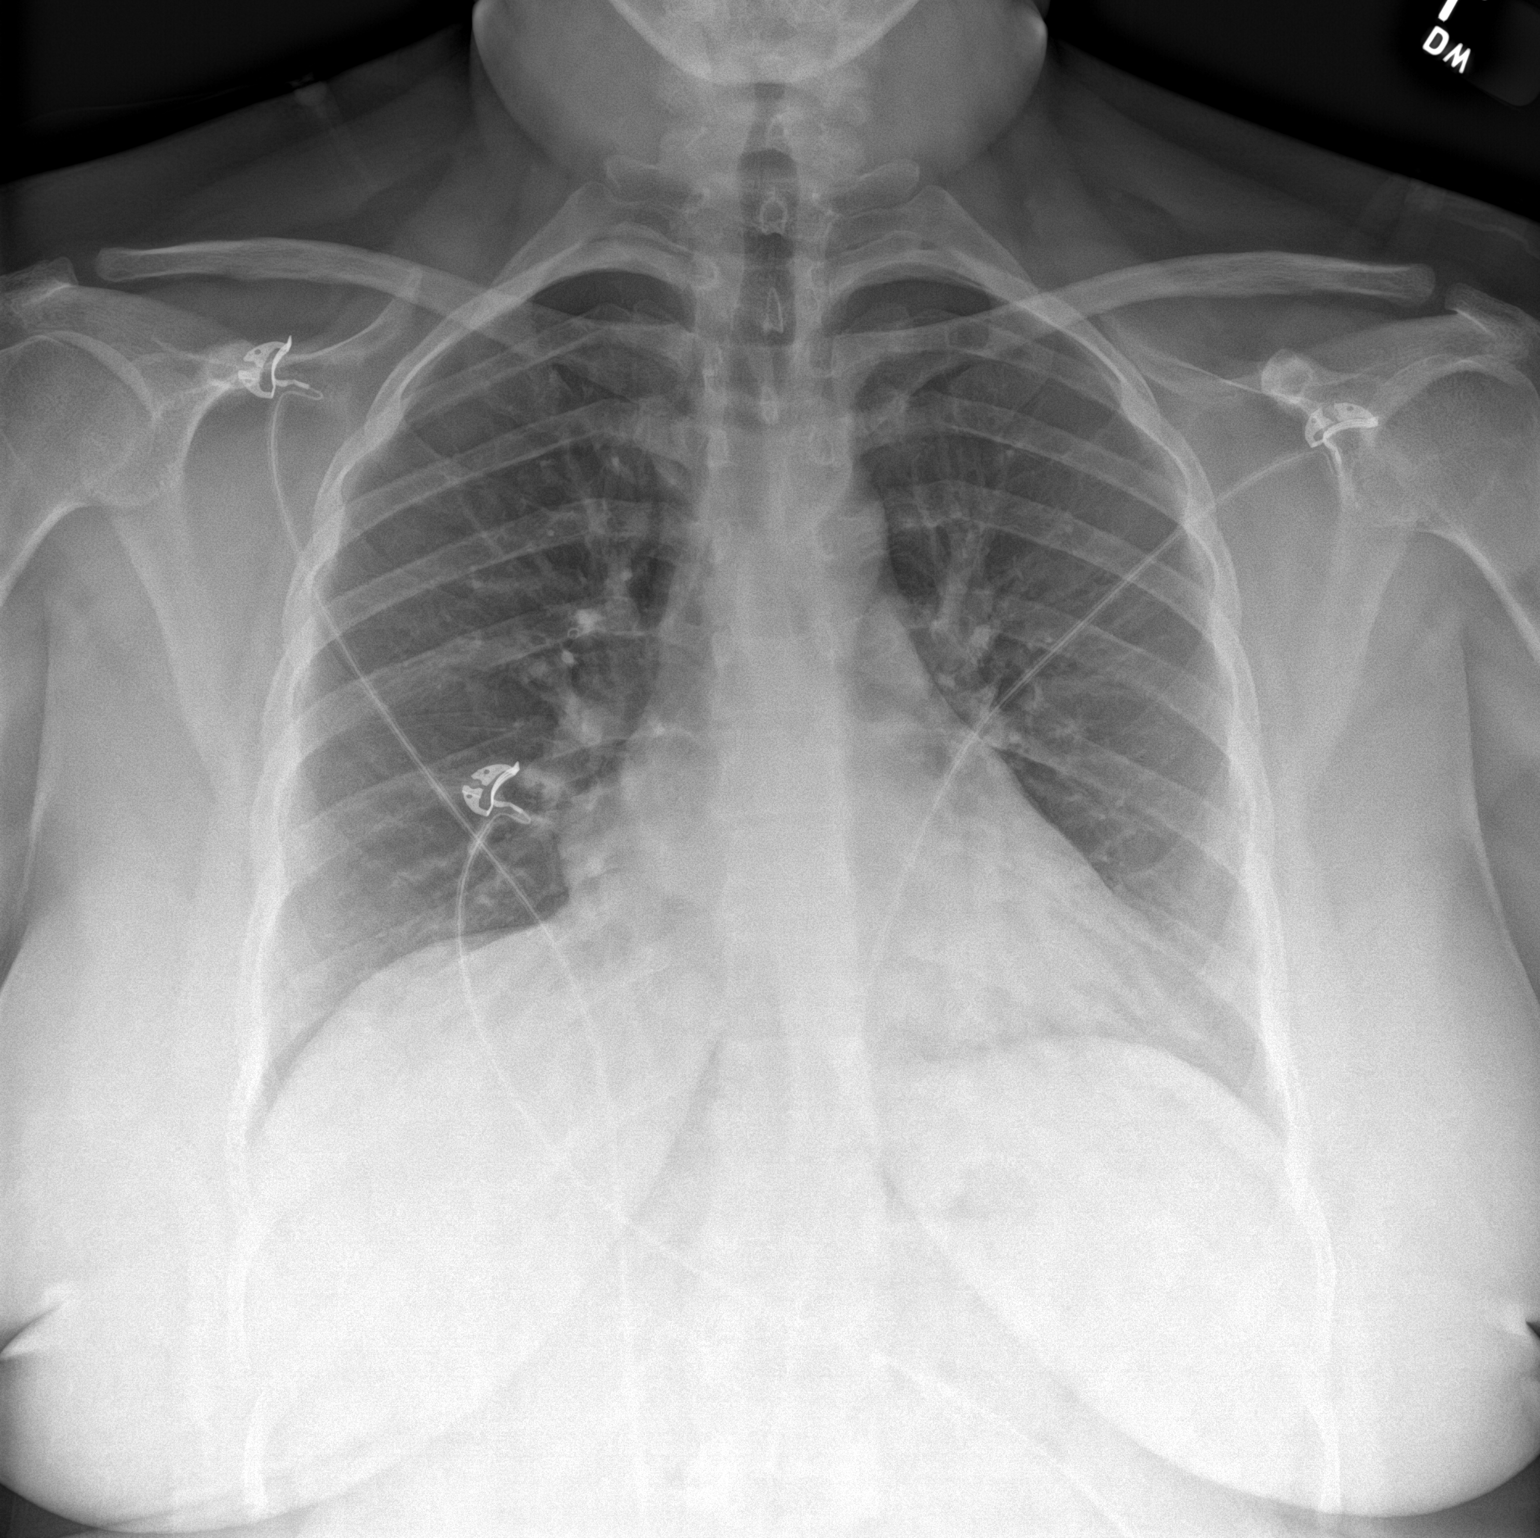

[chest lat]
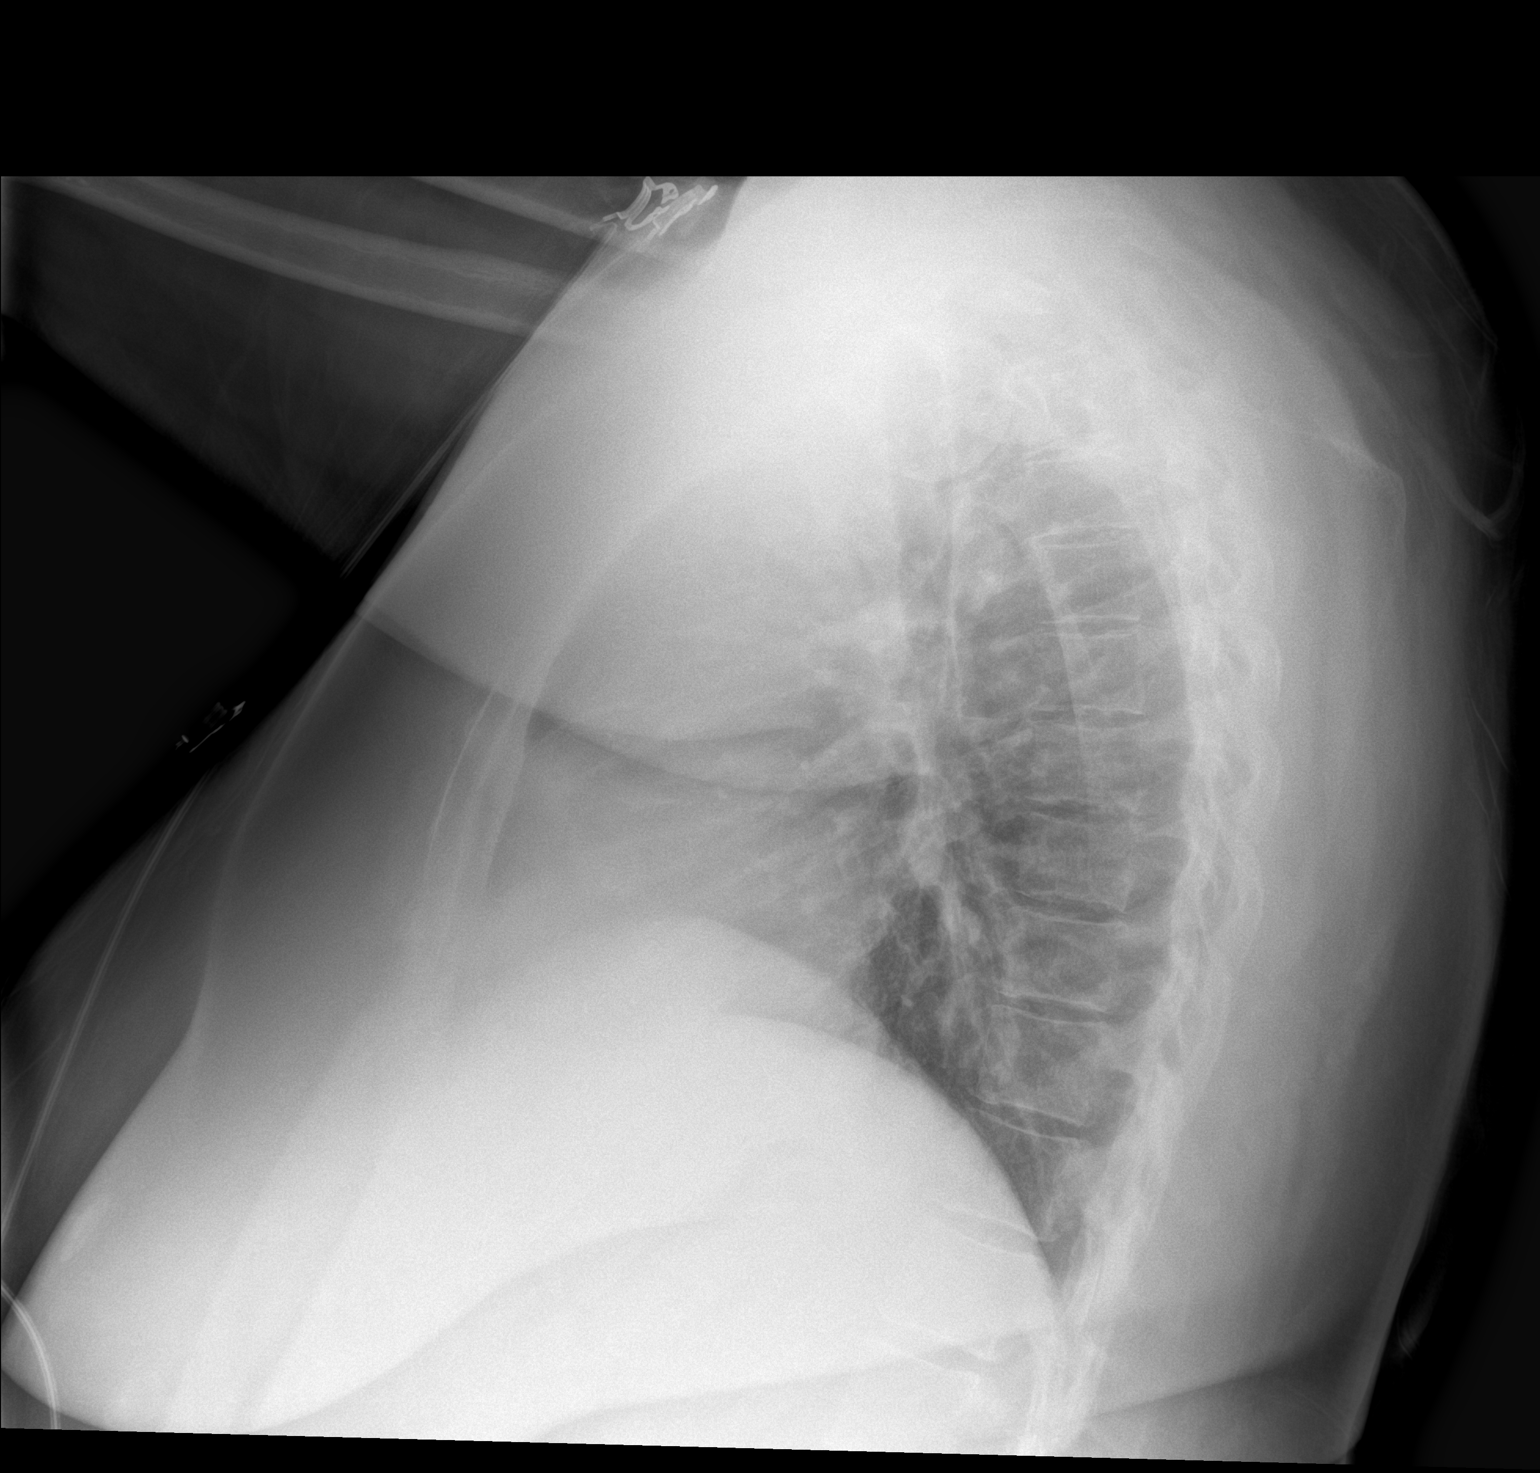

[2 of 2 positions shown; findings below may reference images not displayed]

FINDINGS: The heart size and mediastinal contours are within normal limits.
The lungs are better inflated than previously. Both lungs are clear.
The visualized skeletal structures are unremarkable.
IMPRESSION: No evidence of acute chest process. The lungs are better inflated
than previously. Questionable opacity on the portable chest film
appears to have been due to atelectasis. If there is concern for
occult pneumonia, CT may be helpful.
